# Patient Record
Sex: Female | Born: 1945 | Race: White | Hispanic: No | Marital: Married | Smoking: Former smoker
Health system: Southern US, Community
[De-identification: ages and names within clinical notes are randomized; demographics above are authoritative.]

## PROBLEM LIST (undated history)

## (undated) DIAGNOSIS — Z9289 Personal history of other medical treatment: Secondary | ICD-10-CM

## (undated) DIAGNOSIS — J45909 Unspecified asthma, uncomplicated: Secondary | ICD-10-CM

## (undated) DIAGNOSIS — I4821 Permanent atrial fibrillation: Secondary | ICD-10-CM

## (undated) DIAGNOSIS — E039 Hypothyroidism, unspecified: Secondary | ICD-10-CM

## (undated) DIAGNOSIS — J449 Chronic obstructive pulmonary disease, unspecified: Secondary | ICD-10-CM

## (undated) HISTORY — DX: Personal history of other medical treatment: Z92.89

## (undated) HISTORY — DX: Hypothyroidism, unspecified: E03.9

## (undated) HISTORY — DX: Permanent atrial fibrillation: I48.21

---

## 1987-01-22 HISTORY — PX: AUGMENTATION MAMMAPLASTY: SUR837

## 2004-09-27 ENCOUNTER — Ambulatory Visit: Payer: Self-pay | Admitting: Internal Medicine

## 2009-02-04 ENCOUNTER — Inpatient Hospital Stay: Payer: Self-pay | Admitting: Internal Medicine

## 2010-09-14 ENCOUNTER — Emergency Department: Payer: Self-pay | Admitting: Emergency Medicine

## 2010-09-18 ENCOUNTER — Ambulatory Visit: Payer: Self-pay | Admitting: Internal Medicine

## 2011-02-06 ENCOUNTER — Ambulatory Visit: Payer: Self-pay | Admitting: Unknown Physician Specialty

## 2011-02-06 LAB — URINALYSIS, COMPLETE
Bilirubin,UR: NEGATIVE
Glucose,UR: NEGATIVE mg/dL (ref 0–75)
Leukocyte Esterase: NEGATIVE
Nitrite: NEGATIVE
RBC,UR: 3 /HPF (ref 0–5)
Squamous Epithelial: 1

## 2011-02-06 LAB — BASIC METABOLIC PANEL
Anion Gap: 10 (ref 7–16)
BUN: 20 mg/dL — ABNORMAL HIGH (ref 7–18)
Calcium, Total: 9.4 mg/dL (ref 8.5–10.1)
Chloride: 102 mmol/L (ref 98–107)
Co2: 32 mmol/L (ref 21–32)
EGFR (African American): >60
EGFR (Non-African Amer.): >60
Glucose: 128 mg/dL — ABNORMAL HIGH (ref 65–99)
Sodium: 144 mmol/L (ref 136–145)

## 2011-02-06 LAB — CBC
MCH: 32.6 pg (ref 26.0–34.0)
MCHC: 34 g/dL (ref 32.0–36.0)
MCV: 96 fL (ref 80–100)
Platelet: 400 10*3/uL (ref 150–440)

## 2011-02-08 ENCOUNTER — Ambulatory Visit: Payer: Self-pay | Admitting: Unknown Physician Specialty

## 2011-07-16 ENCOUNTER — Inpatient Hospital Stay: Payer: Self-pay | Admitting: Internal Medicine

## 2011-07-16 LAB — CBC WITH DIFFERENTIAL/PLATELET
Basophil %: 0.9 %
Eosinophil #: 0.2 10*3/uL (ref 0.0–0.7)
Eosinophil %: 1.2 %
HCT: 39.3 % (ref 35.0–47.0)
HGB: 12.9 g/dL (ref 12.0–16.0)
Lymphocyte #: 2.5 10*3/uL (ref 1.0–3.6)
Lymphocyte %: 15 %
MCH: 30.3 pg (ref 26.0–34.0)
MCHC: 32.7 g/dL (ref 32.0–36.0)
MCV: 93 fL (ref 80–100)
Monocyte #: 1.9 x10 3/mm — ABNORMAL HIGH (ref 0.2–0.9)
Monocyte %: 11.2 %
Neutrophil %: 71.7 %
Platelet: 479 10*3/uL — ABNORMAL HIGH (ref 150–440)
RDW: 13.4 % (ref 11.5–14.5)
WBC: 16.7 10*3/uL — ABNORMAL HIGH (ref 3.6–11.0)

## 2011-07-16 LAB — BASIC METABOLIC PANEL
Calcium, Total: 9.2 mg/dL (ref 8.5–10.1)
Chloride: 98 mmol/L (ref 98–107)
Co2: 30 mmol/L (ref 21–32)
EGFR (Non-African Amer.): >60
Osmolality: 269 (ref 275–301)
Sodium: 134 mmol/L — ABNORMAL LOW (ref 136–145)

## 2011-07-17 LAB — CBC WITH DIFFERENTIAL/PLATELET
Basophil #: 0.1 10*3/uL (ref 0.0–0.1)
Basophil %: 0.4 %
Eosinophil %: 0 %
HCT: 37.9 % (ref 35.0–47.0)
Lymphocyte #: 0.7 10*3/uL — ABNORMAL LOW (ref 1.0–3.6)
Lymphocyte %: 4.4 %
MCH: 29.9 pg (ref 26.0–34.0)
MCV: 93 fL (ref 80–100)
Neutrophil #: 14.1 10*3/uL — ABNORMAL HIGH (ref 1.4–6.5)
RBC: 4.06 10*6/uL (ref 3.80–5.20)
WBC: 15.1 10*3/uL — ABNORMAL HIGH (ref 3.6–11.0)

## 2011-07-17 LAB — BASIC METABOLIC PANEL
BUN: 7 mg/dL (ref 7–18)
Chloride: 101 mmol/L (ref 98–107)
Co2: 27 mmol/L (ref 21–32)
Creatinine: 0.89 mg/dL (ref 0.60–1.30)
EGFR (African American): >60
EGFR (Non-African Amer.): >60
Glucose: 217 mg/dL — ABNORMAL HIGH (ref 65–99)
Potassium: 3.5 mmol/L (ref 3.5–5.1)
Sodium: 138 mmol/L (ref 136–145)

## 2012-01-20 ENCOUNTER — Encounter: Payer: Self-pay | Admitting: Physical Therapy

## 2013-08-30 DIAGNOSIS — G500 Trigeminal neuralgia: Secondary | ICD-10-CM | POA: Insufficient documentation

## 2013-08-30 DIAGNOSIS — Z87442 Personal history of urinary calculi: Secondary | ICD-10-CM | POA: Insufficient documentation

## 2013-08-30 DIAGNOSIS — E039 Hypothyroidism, unspecified: Secondary | ICD-10-CM | POA: Insufficient documentation

## 2013-08-30 DIAGNOSIS — G2581 Restless legs syndrome: Secondary | ICD-10-CM | POA: Insufficient documentation

## 2013-09-08 ENCOUNTER — Ambulatory Visit: Payer: Self-pay | Admitting: Internal Medicine
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
## 2014-05-15 NOTE — Op Note (Signed)
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   PROCEDURES: L4-L5 partial hemilaminectomy, partial facetectomy and foraminotomy with diskectomy left side.   SURGEON: Winn JockJames C. Gerrit Heckaliff, MD  ASSISTANT: Doreene Nest. Mundy, PA-C.   ANESTHESIA: General.   ESTIMATED BLOOD LOSS: 200 mL.  REPLACEMENT: 1200 mL of crystalloid.   DRAINS: None.   COMPLICATIONS: None.   BRIEF CLINICAL NOTE AND PATHOLOGY: The patient had persistent radicular pain which initially responded to conservative treatment including epidural injections. Pain, however, became recurrent, persistent and progressive with weakness. Options, risks, and benefits were discussed and patient elected to proceed with operative intervention. At the time of the procedure, there was a combination of older scarred material with fresh new disk material. There was marked stenosis and disk material within the foramen of the L5 nerve root. During the procedure with some adherent dura a very small dural perforation occurred. This was repaired without any evidence of further leak with Valsalva.   DESCRIPTION OF PROCEDURE: Preop antibiotics, adequate general anesthesia, prone position, Wilson frame used, all prominences well padded. Routine prepping and draping. Lateral fluoroscopy used to identify the appropriate level with a spinal needle and routine posterior approach was made. Fascia was opened in line with the incision and subperiosteal dissection was performed. Retractor was placed.   The microscope was then brought onto the field. Using a combination of the bur and a 45 degrees Kerrison the partial hemilaminectomy was performed. The nerve root was identified and carefully retracted. The disk material was removed. Some of this  was also removed from the foramen. The incision was thoroughly irrigated throughout the procedure. With use of the bur for further decompression a small perforation occurred as noted above. This was closed with one Nurolon stitch and use of DuraSeal. Valsalva showed no evidence of dural leak. The incision was thoroughly irrigated. Hemostasis was good. The fascia was closed with #2 quill after the area was covered with Surgiflo. Soft sterile dressing was applied after 0 quill and staples were used. Sponge and needle counts reported as correct prior to and after wound closure. The patient was awakened, taken to the postanesthesia care unit having tolerated the procedure well.    ____________________________ Winn JockJames C. Gerrit Heckaliff, MD jcc:cms D: 02/09/2011 07:03:46 ET T: 02/09/2011 10:53:14 ET JOB#: [id number]  cc: Winn JockJames C. Gerrit Heckaliff, MD, <Dictator> Winn JockJAMES C Arlind Klingerman MD ELECTRONICALLY SIGNED 02/15/2011 12:27

## 2015-02-02 ENCOUNTER — Other Ambulatory Visit: Payer: Self-pay | Admitting: Obstetrics and Gynecology

## 2015-02-02 DIAGNOSIS — Z1231 Encounter for screening mammogram for malignant neoplasm of breast: Secondary | ICD-10-CM

## 2015-02-07 ENCOUNTER — Other Ambulatory Visit: Payer: Self-pay | Admitting: Obstetrics and Gynecology

## 2015-02-07 ENCOUNTER — Ambulatory Visit
Admission: RE | Admit: 2015-02-07 | Discharge: 2015-02-07 | Disposition: A | Discharge status: Home / Self Care | Payer: Medicare Other | Source: Ambulatory Visit | Attending: Obstetrics and Gynecology | Admitting: Obstetrics and Gynecology

## 2015-02-07 DIAGNOSIS — Z1231 Encounter for screening mammogram for malignant neoplasm of breast: Secondary | ICD-10-CM | POA: Diagnosis present

## 2015-02-07 DIAGNOSIS — Z9882 Breast implant status: Secondary | ICD-10-CM | POA: Insufficient documentation

## 2016-10-22 ENCOUNTER — Other Ambulatory Visit: Payer: Self-pay | Admitting: *Deleted

## 2017-03-04 ENCOUNTER — Encounter: Payer: Self-pay | Admitting: Emergency Medicine

## 2017-03-04 ENCOUNTER — Emergency Department: Payer: Medicare Other

## 2017-03-04 ENCOUNTER — Other Ambulatory Visit: Payer: Self-pay | Admitting: Physician Assistant

## 2017-03-04 ENCOUNTER — Ambulatory Visit: Payer: Medicare Other

## 2017-03-04 ENCOUNTER — Other Ambulatory Visit: Payer: Self-pay

## 2017-03-04 ENCOUNTER — Inpatient Hospital Stay
Admission: EM | Admit: 2017-03-04 | Discharge: 2017-03-06 | DRG: 308 | Disposition: A | Discharge status: Home / Self Care | Payer: Medicare Other | Attending: Internal Medicine | Admitting: Internal Medicine

## 2017-03-04 DIAGNOSIS — E039 Hypothyroidism, unspecified: Secondary | ICD-10-CM | POA: Diagnosis present

## 2017-03-04 DIAGNOSIS — R0602 Shortness of breath: Secondary | ICD-10-CM

## 2017-03-04 DIAGNOSIS — Z79899 Other long term (current) drug therapy: Secondary | ICD-10-CM

## 2017-03-04 DIAGNOSIS — J4520 Mild intermittent asthma, uncomplicated: Secondary | ICD-10-CM | POA: Diagnosis present

## 2017-03-04 DIAGNOSIS — M351 Other overlap syndromes: Secondary | ICD-10-CM | POA: Diagnosis present

## 2017-03-04 DIAGNOSIS — G500 Trigeminal neuralgia: Secondary | ICD-10-CM | POA: Diagnosis present

## 2017-03-04 DIAGNOSIS — I4891 Unspecified atrial fibrillation: Secondary | ICD-10-CM | POA: Diagnosis not present

## 2017-03-04 DIAGNOSIS — G2581 Restless legs syndrome: Secondary | ICD-10-CM | POA: Diagnosis present

## 2017-03-04 DIAGNOSIS — R079 Chest pain, unspecified: Secondary | ICD-10-CM

## 2017-03-04 DIAGNOSIS — Z66 Do not resuscitate: Secondary | ICD-10-CM | POA: Diagnosis present

## 2017-03-04 DIAGNOSIS — J209 Acute bronchitis, unspecified: Secondary | ICD-10-CM | POA: Diagnosis present

## 2017-03-04 DIAGNOSIS — R0600 Dyspnea, unspecified: Secondary | ICD-10-CM

## 2017-03-04 DIAGNOSIS — R946 Abnormal results of thyroid function studies: Secondary | ICD-10-CM | POA: Diagnosis present

## 2017-03-04 DIAGNOSIS — J440 Chronic obstructive pulmonary disease with acute lower respiratory infection: Secondary | ICD-10-CM | POA: Diagnosis present

## 2017-03-04 DIAGNOSIS — J189 Pneumonia, unspecified organism: Secondary | ICD-10-CM

## 2017-03-04 DIAGNOSIS — R0789 Other chest pain: Secondary | ICD-10-CM

## 2017-03-04 DIAGNOSIS — J9601 Acute respiratory failure with hypoxia: Secondary | ICD-10-CM | POA: Diagnosis present

## 2017-03-04 DIAGNOSIS — F1120 Opioid dependence, uncomplicated: Secondary | ICD-10-CM | POA: Diagnosis present

## 2017-03-04 HISTORY — DX: Chronic obstructive pulmonary disease, unspecified: J44.9

## 2017-03-04 HISTORY — DX: Unspecified asthma, uncomplicated: J45.909

## 2017-03-04 LAB — HEPATIC FUNCTION PANEL
ALBUMIN: 4.1 g/dL (ref 3.5–5.0)
ALT: 43 U/L (ref 14–54)
AST: 55 U/L — ABNORMAL HIGH (ref 15–41)
Alkaline Phosphatase: 112 U/L (ref 38–126)
BILIRUBIN DIRECT: 0.2 mg/dL (ref 0.1–0.5)
BILIRUBIN TOTAL: 1.1 mg/dL (ref 0.3–1.2)
Indirect Bilirubin: 0.9 mg/dL (ref 0.3–0.9)
Total Protein: 8.3 g/dL — ABNORMAL HIGH (ref 6.5–8.1)

## 2017-03-04 LAB — BASIC METABOLIC PANEL
Anion gap: 6 (ref 5–15)
BUN: 12 mg/dL (ref 6–20)
CHLORIDE: 104 mmol/L (ref 101–111)
CO2: 29 mmol/L (ref 22–32)
Calcium: 9.3 mg/dL (ref 8.9–10.3)
Creatinine, Ser: 0.95 mg/dL (ref 0.44–1.00)
GFR calc non Af Amer: 60 mL/min — ABNORMAL LOW (ref >60)
Glucose, Bld: 79 mg/dL (ref 65–99)
Potassium: 5 mmol/L (ref 3.5–5.1)
SODIUM: 139 mmol/L (ref 135–145)

## 2017-03-04 LAB — CBC
HEMATOCRIT: 44.5 % (ref 35.0–47.0)
Hemoglobin: 14.9 g/dL (ref 12.0–16.0)
MCH: 32.3 pg (ref 26.0–34.0)
MCHC: 33.4 g/dL (ref 32.0–36.0)
MCV: 96.6 fL (ref 80.0–100.0)
PLATELETS: 388 10*3/uL (ref 150–440)
RBC: 4.6 MIL/uL (ref 3.80–5.20)
RDW: 13.5 % (ref 11.5–14.5)
WBC: 14.2 10*3/uL — AB (ref 3.6–11.0)

## 2017-03-04 LAB — TROPONIN I

## 2017-03-04 LAB — TSH
TSH: 9.962 u[IU]/mL — AB (ref 0.350–4.500)
TSH: 6.46 u[IU]/mL — AB (ref 0.350–4.500)

## 2017-03-04 LAB — FIBRIN DERIVATIVES D-DIMER (ARMC ONLY): Fibrin derivatives D-dimer (ARMC): 857.08 ng/mL (FEU) — ABNORMAL HIGH (ref 0.00–499.00)

## 2017-03-04 LAB — LIPASE, BLOOD: LIPASE: 22 U/L (ref 11–51)

## 2017-03-04 LAB — T4, FREE: Free T4: 0.85 ng/dL (ref 0.61–1.12)

## 2017-03-04 MED ORDER — ENOXAPARIN SODIUM 40 MG/0.4ML ~~LOC~~ SOLN
40.0000 mg | SUBCUTANEOUS | Status: DC
  Administered 2017-03-04 – 2017-03-05 (×2): 40 mg via SUBCUTANEOUS
  Filled 2017-03-04: qty 0.4

## 2017-03-04 MED ORDER — ONDANSETRON HCL 4 MG/2ML IJ SOLN
INTRAMUSCULAR | Status: AC
  Filled 2017-03-04: qty 2

## 2017-03-04 MED ORDER — MORPHINE SULFATE (PF) 2 MG/ML IV SOLN: 2.0000 mg | Freq: Once | INTRAVENOUS | Status: DC

## 2017-03-04 MED ORDER — CARBIDOPA-LEVODOPA 25-100 MG PO TABS
1.0000 | ORAL_TABLET | Freq: Three times a day (TID) | ORAL | Status: DC
  Administered 2017-03-04: 1 via ORAL
  Filled 2017-03-04 (×3): qty 1

## 2017-03-04 MED ORDER — IOPAMIDOL (ISOVUE-370) INJECTION 76%
75.0000 mL | Freq: Once | INTRAVENOUS | Status: AC | PRN
  Administered 2017-03-04: 75 mL via INTRAVENOUS

## 2017-03-04 MED ORDER — ONDANSETRON HCL 4 MG/2ML IJ SOLN: 4.0000 mg | Freq: Once | INTRAMUSCULAR | Status: DC

## 2017-03-04 MED ORDER — ONDANSETRON HCL 4 MG/2ML IJ SOLN: 4.0000 mg | Freq: Four times a day (QID) | INTRAMUSCULAR | Status: DC | PRN

## 2017-03-04 MED ORDER — OXYCODONE-ACETAMINOPHEN 5-325 MG PO TABS
1.0000 | ORAL_TABLET | Freq: Three times a day (TID) | ORAL | Status: DC
  Filled 2017-03-04: qty 1

## 2017-03-04 MED ORDER — ASPIRIN EC 325 MG PO TBEC
DELAYED_RELEASE_TABLET | ORAL | Status: AC
  Administered 2017-03-04: 325 mg via ORAL
  Filled 2017-03-04: qty 1

## 2017-03-04 MED ORDER — ENOXAPARIN SODIUM 40 MG/0.4ML ~~LOC~~ SOLN
SUBCUTANEOUS | Status: AC
  Administered 2017-03-04: 40 mg via SUBCUTANEOUS
  Filled 2017-03-04: qty 0.4

## 2017-03-04 MED ORDER — POLYETHYLENE GLYCOL 3350 17 G PO PACK: 17.0000 g | PACK | Freq: Every day | ORAL | Status: DC | PRN

## 2017-03-04 MED ORDER — MORPHINE SULFATE (PF) 2 MG/ML IV SOLN
INTRAVENOUS | Status: AC
  Filled 2017-03-04: qty 1

## 2017-03-04 MED ORDER — OXYCODONE HCL 5 MG PO TABS
5.0000 mg | ORAL_TABLET | Freq: Three times a day (TID) | ORAL | Status: DC
  Filled 2017-03-04: qty 1

## 2017-03-04 MED ORDER — TRAMADOL HCL 50 MG PO TABS
50.0000 mg | ORAL_TABLET | Freq: Four times a day (QID) | ORAL | Status: DC | PRN
  Administered 2017-03-05 – 2017-03-06 (×3): 50 mg via ORAL
  Filled 2017-03-04 (×3): qty 1

## 2017-03-04 MED ORDER — OXYCODONE-ACETAMINOPHEN 5-325 MG PO TABS
ORAL_TABLET | ORAL | Status: AC
  Administered 2017-03-04: 1
  Filled 2017-03-04: qty 1

## 2017-03-04 MED ORDER — CEFTRIAXONE SODIUM 1 G IJ SOLR
1.0000 g | Freq: Once | INTRAMUSCULAR | Status: AC
  Administered 2017-03-04: 1 g via INTRAVENOUS
  Filled 2017-03-04: qty 10

## 2017-03-04 MED ORDER — OXYCODONE-ACETAMINOPHEN 10-325 MG PO TABS
1.0000 | ORAL_TABLET | Freq: Three times a day (TID) | ORAL | Status: DC
Start: 2017-03-04 — End: 2017-03-04

## 2017-03-04 MED ORDER — AZITHROMYCIN 500 MG IV SOLR: 500.0000 mg | Freq: Once | INTRAVENOUS | Status: DC

## 2017-03-04 MED ORDER — ASPIRIN 81 MG PO CHEW
324.0000 mg | CHEWABLE_TABLET | Freq: Once | ORAL | Status: AC
  Administered 2017-03-04: 324 mg via ORAL

## 2017-03-04 MED ORDER — LEVOTHYROXINE SODIUM 112 MCG PO TABS
112.0000 ug | ORAL_TABLET | Freq: Every day | ORAL | Status: DC
  Administered 2017-03-05: 112 ug via ORAL
  Filled 2017-03-04: qty 1

## 2017-03-04 MED ORDER — LEVOFLOXACIN IN D5W 750 MG/150ML IV SOLN
750.0000 mg | Freq: Once | INTRAVENOUS | Status: AC
  Administered 2017-03-04: 750 mg via INTRAVENOUS
  Filled 2017-03-04: qty 150

## 2017-03-04 MED ORDER — METOPROLOL TARTRATE 5 MG/5ML IV SOLN: 5.0000 mg | INTRAVENOUS | Status: DC | PRN

## 2017-03-04 MED ORDER — ASPIRIN 81 MG PO CHEW
CHEWABLE_TABLET | ORAL | Status: AC
  Administered 2017-03-04: 324 mg via ORAL
  Filled 2017-03-04: qty 4

## 2017-03-04 MED ORDER — LEVOFLOXACIN IN D5W 750 MG/150ML IV SOLN
750.0000 mg | Freq: Every day | INTRAVENOUS | Status: DC
  Filled 2017-03-04: qty 150

## 2017-03-04 MED ORDER — ACETAMINOPHEN 325 MG PO TABS
650.0000 mg | ORAL_TABLET | Freq: Four times a day (QID) | ORAL | Status: DC | PRN
  Administered 2017-03-05: 650 mg via ORAL
  Filled 2017-03-04: qty 2

## 2017-03-04 MED ORDER — OXYCODONE-ACETAMINOPHEN 5-325 MG PO TABS
1.0000 | ORAL_TABLET | Freq: Once | ORAL | Status: AC
  Administered 2017-03-04: 1 via ORAL
  Filled 2017-03-04: qty 1

## 2017-03-04 MED ORDER — ACETAMINOPHEN 650 MG RE SUPP: 650.0000 mg | Freq: Four times a day (QID) | RECTAL | Status: DC | PRN

## 2017-03-04 MED ORDER — ASPIRIN EC 325 MG PO TBEC
325.0000 mg | DELAYED_RELEASE_TABLET | Freq: Every day | ORAL | Status: DC
  Administered 2017-03-04 – 2017-03-06 (×3): 325 mg via ORAL
  Filled 2017-03-04 (×2): qty 1

## 2017-03-04 MED ORDER — ONDANSETRON HCL 4 MG PO TABS: 4.0000 mg | ORAL_TABLET | Freq: Four times a day (QID) | ORAL | Status: DC | PRN

## 2017-03-04 NOTE — H&P (Signed)
[facility] - [place] [facility]   PATIENT NAME: Alicia Krause    MR#:  [id number]  DATE OF BIRTH:  1945-12-08  DATE OF ADMISSION:  03/04/2017  PRIMARY CARE PHYSICIAN: Gracelyn Nurse, MD   REQUESTING/REFERRING PHYSICIAN: dr Darnelle Catalan  CHIEF COMPLAINT:   sob HISTORY OF PRESENT ILLNESS:  Alicia Krause  is a 72 y.o. female with a known history of mild intermittent asthma who presents today to the emergency room complaining of left-sided chest pain as well as shortness of breath. Patient went to her primary care physician today due to shortness of breath and palpitations. She was found to have new onset atrial fibrillation. Upon arrival to the ER she was found to have low oxygen saturations. She was clammy and diaphoretic. She continues to complain of pleuritic chest pain on the left side. No relieving factors. Patient denies fever or chills.  Chest x-ray shows left lower lobe pneumonia. CT of the chest does not show evidence of pulmonary emboli.  Patient has no history of atrial fibrillation. She is found to have new onset atrial fibrillation.  PAST MEDICAL HISTORY:   Past Medical History:  Diagnosis Date  . Asthma     PAST SURGICAL HISTORY:   Past Surgical History:  Procedure Laterality Date  . AUGMENTATION MAMMAPLASTY Bilateral 1989   SILICONE - REPLACED IN 2003 WITH COMBO - RETROPECTORALIS    SOCIAL HISTORY:   Social History   Tobacco Use  . Smoking status: Not on file  Substance Use Topics  . Alcohol use: Not on file    FAMILY HISTORY:   Family History  Problem Relation Age of Onset  . Breast cancer Neg Hx     DRUG ALLERGIES:  No Known Allergies  REVIEW OF SYSTEMS:   Review of Systems  Constitutional: Negative.  Negative for chills, fever and malaise/fatigue.  HENT: Negative.  Negative for ear discharge, ear pain, hearing loss, nosebleeds and sore throat.   Eyes: Negative.  Negative for blurred vision and pain.  Respiratory: Positive for  shortness of breath. Negative for cough, hemoptysis and wheezing.   Cardiovascular: Positive for chest pain. Negative for palpitations and leg swelling.  Gastrointestinal: Negative.  Negative for abdominal pain, blood in stool, diarrhea, nausea and vomiting.  Genitourinary: Negative.  Negative for dysuria.  Musculoskeletal: Negative.  Negative for back pain.  Skin: Negative.   Neurological: Negative for dizziness, tremors, speech change, focal weakness, seizures and headaches.  Endo/Heme/Allergies: Negative.  Does not bruise/bleed easily.  Psychiatric/Behavioral: Negative.  Negative for depression, hallucinations and suicidal ideas.    MEDICATIONS AT HOME:   Prior to Admission medications   Medication Sig Start Date End Date Taking? Authorizing Provider  carbidopa-levodopa (SINEMET IR) 25-100 MG tablet Take 1 tablet by mouth 3 (three) times daily. 02/26/17   [provider]  levothyroxine (SYNTHROID, LEVOTHROID) 112 MCG tablet Take 1 tablet by mouth daily. 02/18/17   [provider]  oxyCODONE-acetaminophen (PERCOCET) 10-325 MG tablet Take 1 tablet by mouth 3 (three) times daily. 02/27/17   [provider]      VITAL SIGNS:  Blood pressure 123/66, pulse 97, temperature 98.9 F (37.2 C), temperature source Oral, resp. rate 20, height 5\' 4"  (1.626 m), weight 74.4 kg (164 lb), SpO2 95 %.  PHYSICAL EXAMINATION:   Physical Exam  Constitutional: She is oriented to person, place, and time and well-developed, well-nourished, and in no distress. No distress.  HENT:  Head: Normocephalic.  Eyes: No scleral icterus.  Neck: Normal range  of motion. Neck supple. No JVD present. No tracheal deviation present.  Cardiovascular: Normal rate and normal heart sounds. Exam reveals no gallop and no friction rub.  No murmur heard. Irregular, irregular  Pulmonary/Chest: Breath sounds normal. No respiratory distress. She has no wheezes. She has no rales. She exhibits no tenderness.   Patient is sitting upright. With increased respiratory effort. Crackles at the left base  Abdominal: Soft. Bowel sounds are normal. She exhibits no distension and no mass. There is no tenderness. There is no rebound and no guarding.  Musculoskeletal: Normal range of motion. She exhibits no edema.  Neurological: She is alert and oriented to person, place, and time.  Skin: Skin is warm. No rash noted. No erythema.  Psychiatric: Affect and judgment normal.      LABORATORY PANEL:   CBC Recent Labs  Lab 03/04/17 1545  WBC 14.2*  HGB 14.9  HCT 44.5  PLT 388   ------------------------------------------------------------------------------------------------------------------  Chemistries  Recent Labs  Lab 03/04/17 1545  NA 139  K 5.0  CL 104  CO2 29  GLUCOSE 79  BUN 12  CREATININE 0.95  CALCIUM 9.3   ------------------------------------------------------------------------------------------------------------------  Cardiac Enzymes Recent Labs  Lab 03/04/17 1545  TROPONINI <0.03   ------------------------------------------------------------------------------------------------------------------  RADIOLOGY:  Dg Chest 2 View  Result Date: 03/04/2017 CLINICAL DATA:  Shortness of breath EXAM: CHEST  2 VIEW COMPARISON:  07/16/2011 FINDINGS: Two views study shows hyperexpansion. Hazy opacity overlying the lower lungs on the frontal film is compatible with superimposition of soft tissue. However, there is density in the left costophrenic angle and at the cardiac apex, also visible on the lateral film, raising the question of pneumonia. The cardiopericardial silhouette is within normal limits for size. The visualized bony structures of the thorax are intact. IMPRESSION: Opacity in the left lung base, possible pneumonia. Follow-up imaging recommended to ensure resolution. Electronically Signed   By: Kennith Center M.D.   On: 03/04/2017 16:20   Ct Angio Chest Pe W And/or Wo  Contrast  Result Date: 03/04/2017 CLINICAL DATA:  72 year old female with tachycardia and shortness of breath since 1700 hr yesterday. Progressive symptoms. New onset atrial fibrillation. Diaphoretic. EXAM: CT ANGIOGRAPHY CHEST WITH CONTRAST TECHNIQUE: Multidetector CT imaging of the chest was performed using the standard protocol during bolus administration of intravenous contrast. Multiplanar CT image reconstructions and MIPs were obtained to evaluate the vascular anatomy. CONTRAST:  75mL ISOVUE-370 IOPAMIDOL (ISOVUE-370) INJECTION 76% COMPARISON:  Chest radiographs 03/04/2017 and earlier. FINDINGS: Cardiovascular: Excellent contrast bolus timing in the pulmonary arterial tree. Minimal respiratory motion. No focal filling defect identified in the pulmonary arteries to suggest acute pulmonary embolism. No cardiomegaly or pericardial effusion. Negative visible aorta aside from intermittent mild soft and calcified atherosclerotic plaque. Mediastinum/Nodes: No lymphadenopathy. Small partially calcified left AP window lymph node incidentally noted. Lungs/Pleura: Major airways are patent. There is mild atelectasis in the lingula. There is evidence of bilateral perihilar centrilobular emphysema. There is confluent curvilinear subpleural opacity in the lateral left costophrenic angle which most resembles atelectasis and scarring (series 6, image 75). No pleural effusion or other abnormal pulmonary opacity. Upper Abdomen: Negative visible liver, gallbladder, pancreas, adrenal glands, kidneys, and bowel in the upper abdomen. The spleen appears to be absent, and a small oblong 15 x 35 mm soft tissue focus posterior to the gastric cardia probably reflects splenosis (series 4, image 86). Musculoskeletal: Incidental breast implants. Mild thoracic scoliosis. No acute osseous abnormality identified. Review of the MIP images confirms the above findings. IMPRESSION: 1. Negative  for acute pulmonary embolus. Cardiac size within  normal limits. No pericardial effusion. 2. Emphysema (ICD10-J43.9). Left lung atelectasis and scarring. No acute pulmonary finding. 3. 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 Electronically Signed   By: Odessa FlemingH  Hall M.D.   On: 03/04/2017 17:42    EKG:   Atrial fibrillation heart rate 80 no ST elevation or depression  IMPRESSION AND PLAN:   72 year old female with history of mild intermittent asthma who presents from PCP office with new onset atrial fibrillation and shortness of breath.  1. Acute hypoxic respiratory failure in the setting of community-acquired pneumonia and new onset atrial fibrillation Wean oxygen to room air as tolerated  2. Community-acquired pneumonia: Follow up on blood cultures Levaquin ordered Incentive spirometer ordered  3. New onset atrial fibrillation with controlled heart rate Patient does not want to start heparin or anticoagulation at this time CHADS VASC score is 1 for female Start aspirin which she is agreeable Check echocardiogram Cardiology consultation requested with on call physician TSH is elevated and therefore will check T3 and T4 Continue telemetry When necessary metoprolol    4. Hypothyroidism with elevated TSH Check T3 and T4  5. Mild intermittent asthma/COPD overlap syndrome: Stable  6. History of trigeminal neuralgia/restless leg syndrome Continue Carrie dopa  7. Opiate-type dependence:  All the records are reviewed and case discussed with ED provider. Management plans discussed with the patient and she is in agreement  CODE STATUS: DO NOT RESUSCITATE  TOTAL TIME TAKING CARE OF THIS PATIENT: 40 minutes.    Brylinn Teaney M.D on 03/04/2017 at 5:49 PM  Between 7am to 6pm - Pager - [phone]  After 6pm go to [email or web address] - password [email or web address]  [facility]  Office  [phone]  CC: Primary care physician; Gracelyn NurseJohnston, John D, MD

## 2017-03-04 NOTE — ED Triage Notes (Signed)
Pt reports that she began to get SOB and felt her heart racing yesterday around 5. She went to her PMD today because it was getting worse. They found her to have a new onset of Afib. Pt is clammy and diaphoretic.

## 2017-03-04 NOTE — Progress Notes (Signed)
Pharmacy Antibiotic Note  Alicia Krause is a 72 y.o. female admitted on 03/04/2017 with pneumonia.  Pharmacy has been consulted for levofloxacin dosing.  Plan: Levofloxacin 750 mg IV once ordered in ED. Will order levofloxacin 750 mg IV q24h   Height: 5\' 4"  (162.6 cm) Weight: 164 lb (74.4 kg) IBW/kg (Calculated) : 54.7  Temp (24hrs), Avg:98.9 F (37.2 C), Min:98.9 F (37.2 C), Max:98.9 F (37.2 C)  Recent Labs  Lab 03/04/17 1545  WBC 14.2*  CREATININE 0.95    Estimated Creatinine Clearance: 55.2 mL/min (by C-G formula based on SCr of 0.95 mg/dL).    No Known Allergies  Antimicrobials this admission: levofloxacin 2/12 >>   Dose adjustments this admission:  Microbiology results: 2/12 BCx: Sent  Thank you for allowing pharmacy to be a part of this patient's care.  Cindi CarbonMary M Chidera Thivierge, PharmD, BCPS Clinical Pharmacist 03/04/2017 6:52 PM

## 2017-03-04 NOTE — ED Provider Notes (Signed)
[facility] Emergency Department Provider Note   ____________________________________________   First MD Initiated Contact with Patient 03/04/17 1519     (approximate)  I have reviewed the triage vital signs and the nursing notes.   HISTORY  Chief Complaint Chest Pain; Atrial Fibrillation; and Shortness of Breath    HPI Alicia Krause is a 72 y.o. female Patient reports feeling short of breath and heart racing yesterday and having pain in the left side of the chest under the breast and in the upper chest radiating up into the neck. Pain is worse with movement and deep breathing she cannot lay flat unless she lays on the left side because the pain gets worse. Patient is never had this before.   Past Medical History:  Diagnosis Date  . Asthma     Patient Active Problem List   Diagnosis Date Noted  . Atrial fibrillation (HCC) 03/04/2017    Past Surgical History:  Procedure Laterality Date  . AUGMENTATION MAMMAPLASTY Bilateral 1989   SILICONE - REPLACED IN 2003 WITH COMBO - RETROPECTORALIS    Prior to Admission medications   Medication Sig Start Date End Date Taking? Authorizing Provider  carbidopa-levodopa (SINEMET IR) 25-100 MG tablet Take 1 tablet by mouth 3 (three) times daily. 02/26/17   [provider]  levothyroxine (SYNTHROID, LEVOTHROID) 112 MCG tablet Take 1 tablet by mouth daily. 02/18/17   [provider]  oxyCODONE-acetaminophen (PERCOCET) 10-325 MG tablet Take 1 tablet by mouth 3 (three) times daily. 02/27/17   [provider]    Allergies Patient has no known allergies.  Family History  Problem Relation Age of Onset  . Breast cancer Neg Hx     Social History Social History   Tobacco Use  . Smoking status: Not on file  Substance Use Topics  . Alcohol use: Not on file  . Drug use: Not on file    Review of Systems  Constitutional: No fever/chills Eyes: No visual changes. ENT: No sore  throat. Cardiovascular:chest pain. Respiratory:  shortness of breath. Gastrointestinal: No abdominal pain.  No nausea, no vomiting.  No diarrhea.  No constipation. Genitourinary: Negative for dysuria. Musculoskeletal: Negative for back pain. Skin: Negative for rash. Neurological: Negative for headaches, focal weakness   ____________________________________________   PHYSICAL EXAM:  VITAL SIGNS: ED Triage Vitals  Enc Vitals Group     BP 03/04/17 1508 123/66     Pulse Rate 03/04/17 1508 97     Resp 03/04/17 1508 20     Temp 03/04/17 1508 98.9 F (37.2 C)     Temp Source 03/04/17 1508 Oral     SpO2 03/04/17 1508 95 %     Weight 03/04/17 1509 164 lb (74.4 kg)     Height 03/04/17 1509 5\' 4"  (1.626 m)     Head Circumference --      Peak Flow --      Pain Score 03/04/17 1509 9     Pain Loc --      Pain Edu? --      Excl. in GC? --     Constitutional: Alert and oriented. looks short of breath Eyes: Conjunctivae are normal. Head: Atraumatic. Nose: No congestion/rhinnorhea. Mouth/Throat: Mucous membranes are moist.  Oropharynx non-erythematous. Neck: No stridor.  Cardiovascular: Normal rate, rirregular rhythm. Grossly normal heart sounds.  Good peripheral circulation. Respiratory: Normal respiratory effort.  No retractions. Lungs CTAB.some tenderness on palpation left chest wall under the breast. Not elsewhere Gastrointestinal: Soft and nontender. No distention. No  abdominal bruits. No CVA tenderness. Musculoskeletal: No lower extremity tenderness nor edema.  No joint effusions. Neurologic:  Normal speech and language. No gross focal neurologic deficits are appreciated. No gait instability. Skin:  Skin is warm, dry and intact. No rash noted. Psychiatric: Mood and affect are normal. Speech and behavior are normal.  ____________________________________________   LABS (all labs ordered are listed, but only abnormal results are displayed)  Labs Reviewed  BASIC METABOLIC PANEL  - Abnormal; Notable for the following components:      Result Value   GFR calc non Af Amer 60 (*)    All other components within normal limits  CBC - Abnormal; Notable for the following components:   WBC 14.2 (*)    All other components within normal limits  TSH - Abnormal; Notable for the following components:   TSH 9.962 (*)    All other components within normal limits  FIBRIN DERIVATIVES D-DIMER ([facility] ONLY) - Abnormal; Notable for the following components:   Fibrin derivatives D-dimer ([facility]) 857.08 (*)    All other components within normal limits  CULTURE, BLOOD (ROUTINE X 2)  CULTURE, BLOOD (ROUTINE X 2)  TROPONIN I  HEPATIC FUNCTION PANEL  LIPASE, BLOOD   ____________________________________________  EKG  EKG read and interpreted by me shows what appears to be atrial fibrillation at a rate of 91 normal axis no acute ST-T wave changes ____________________________________________  RADIOLOGY  ED MD interpretation: chest x-ray possible pneumonia CT of the chest shows no PE per radiology Official radiology report(s): Dg Chest 2 View  Result Date: 03/04/2017 CLINICAL DATA:  Shortness of breath EXAM: CHEST  2 VIEW COMPARISON:  07/16/2011 FINDINGS: Two views study shows hyperexpansion. Hazy opacity overlying the lower lungs on the frontal film is compatible with superimposition of soft tissue. However, there is density in the left costophrenic angle and at the cardiac apex, also visible on the lateral film, raising the question of pneumonia. The cardiopericardial silhouette is within normal limits for size. The visualized bony structures of the thorax are intact. IMPRESSION: Opacity in the left lung base, possible pneumonia. Follow-up imaging recommended to ensure resolution. Electronically Signed   By: Kennith Center M.D.   On: 03/04/2017 16:20   Ct Angio Chest Pe W And/or Wo Contrast  Result Date: 03/04/2017 CLINICAL DATA:  72 year old female with tachycardia and shortness of  breath since 1700 hr yesterday. Progressive symptoms. New onset atrial fibrillation. Diaphoretic. EXAM: CT ANGIOGRAPHY CHEST WITH CONTRAST TECHNIQUE: Multidetector CT imaging of the chest was performed using the standard protocol during bolus administration of intravenous contrast. Multiplanar CT image reconstructions and MIPs were obtained to evaluate the vascular anatomy. CONTRAST:  75mL ISOVUE-370 IOPAMIDOL (ISOVUE-370) INJECTION 76% COMPARISON:  Chest radiographs 03/04/2017 and earlier. FINDINGS: Cardiovascular: Excellent contrast bolus timing in the pulmonary arterial tree. Minimal respiratory motion. No focal filling defect identified in the pulmonary arteries to suggest acute pulmonary embolism. No cardiomegaly or pericardial effusion. Negative visible aorta aside from intermittent mild soft and calcified atherosclerotic plaque. Mediastinum/Nodes: No lymphadenopathy. Small partially calcified left AP window lymph node incidentally noted. Lungs/Pleura: Major airways are patent. There is mild atelectasis in the lingula. There is evidence of bilateral perihilar centrilobular emphysema. There is confluent curvilinear subpleural opacity in the lateral left costophrenic angle which most resembles atelectasis and scarring (series 6, image 75). No pleural effusion or other abnormal pulmonary opacity. Upper Abdomen: Negative visible liver, gallbladder, pancreas, adrenal glands, kidneys, and bowel in the upper abdomen. The spleen appears to be absent, and  a small oblong 15 x 35 mm soft tissue focus posterior to the gastric cardia probably reflects splenosis (series 4, image 86). Musculoskeletal: Incidental breast implants. Mild thoracic scoliosis. No acute osseous abnormality identified. Review of the MIP images confirms the above findings. IMPRESSION: 1. Negative for acute pulmonary embolus. Cardiac size within normal limits. No pericardial effusion. 2. Emphysema (ICD10-J43.9). Left lung atelectasis and scarring. No  acute pulmonary finding. 3. 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 Electronically Signed   By: Odessa Fleming M.D.   On: 03/04/2017 17:42    ____________________________________________   PROCEDURES  Procedure(s) performed:   Procedures  Critical Care performed:   ____________________________________________   INITIAL IMPRESSION / ASSESSMENT AND PLAN / ED COURSE  patient with new onset chest pain and dyspnea. No fever she does have a white count. She does have new onset atrial fib. We will give her some antibiotics just in case the density in the chest left lower lung is a pneumonia. We'll investigate causes of A. fib and her anticoagulate her.         ____________________________________________   FINAL CLINICAL IMPRESSION(S) / ED DIAGNOSES  Final diagnoses:  Dyspnea, unspecified type  Chest pain, unspecified type  Community acquired pneumonia, unspecified laterality  New onset atrial fibrillation [facility])     ED Discharge Orders    None       Note:  This document was prepared using Dragon voice recognition software and may include unintentional dictation errors.    Arnaldo Natal, MD 03/04/17 [phone]

## 2017-03-04 NOTE — ED Notes (Signed)
PT reports continued pain at this time. RN noted 10-325 percocet ordered but close to 5-325 administration. ED MD verbal order to give a second 5-325 Percocet recieved

## 2017-03-04 NOTE — Progress Notes (Signed)
Family Meeting Note  Advance Directive:yes  Today a meeting took place with the Patient.spouse  The following clinical team members were present during this meeting:MD  The following were discussed:Patient's diagnosis:new onset atrial fibrillation with community-acquired pneumonia and acute hypoxic respiratory failure , Patient's progosis: > 12 months and Goals for treatment: DNR  Additional follow-up to be provided: Patient's husband will try to bring in advanced rectus No update needed  Time spent during discussion: 16 minutes  Dre Gamino, MD

## 2017-03-05 ENCOUNTER — Inpatient Hospital Stay (HOSPITAL_COMMUNITY)
Admit: 2017-03-05 | Discharge: 2017-03-05 | Disposition: A | Discharge status: Home / Self Care | Payer: Medicare Other | Attending: Internal Medicine | Admitting: Internal Medicine

## 2017-03-05 DIAGNOSIS — I4891 Unspecified atrial fibrillation: Secondary | ICD-10-CM

## 2017-03-05 LAB — CBC
HCT: 41.5 % (ref 35.0–47.0)
HEMOGLOBIN: 13.6 g/dL (ref 12.0–16.0)
MCH: 31.9 pg (ref 26.0–34.0)
MCHC: 32.8 g/dL (ref 32.0–36.0)
MCV: 97.3 fL (ref 80.0–100.0)
PLATELETS: 352 10*3/uL (ref 150–440)
RBC: 4.27 MIL/uL (ref 3.80–5.20)
RDW: 13.3 % (ref 11.5–14.5)
WBC: 11.7 10*3/uL — AB (ref 3.6–11.0)

## 2017-03-05 LAB — ECHOCARDIOGRAM COMPLETE
HEIGHTINCHES: 64 in
WEIGHTICAEL: 2632 [oz_av]

## 2017-03-05 LAB — BASIC METABOLIC PANEL
ANION GAP: 7 (ref 5–15)
BUN: 13 mg/dL (ref 6–20)
CALCIUM: 8.8 mg/dL — AB (ref 8.9–10.3)
CO2: 31 mmol/L (ref 22–32)
CREATININE: 0.81 mg/dL (ref 0.44–1.00)
Chloride: 102 mmol/L (ref 101–111)
GFR calc Af Amer: >60 mL/min (ref >60)
Glucose, Bld: 113 mg/dL — ABNORMAL HIGH (ref 65–99)
Potassium: 4.3 mmol/L (ref 3.5–5.1)
SODIUM: 140 mmol/L (ref 135–145)

## 2017-03-05 LAB — PROCALCITONIN

## 2017-03-05 MED ORDER — MORPHINE SULFATE (PF) 2 MG/ML IV SOLN
2.0000 mg | INTRAVENOUS | Status: DC | PRN
  Administered 2017-03-05: 2 mg via INTRAVENOUS
  Filled 2017-03-05: qty 1

## 2017-03-05 MED ORDER — CARBIDOPA-LEVODOPA 25-100 MG PO TABS
1.0000 | ORAL_TABLET | Freq: Every day | ORAL | Status: DC
  Administered 2017-03-05: 1 via ORAL
  Filled 2017-03-05 (×2): qty 1

## 2017-03-05 MED ORDER — IPRATROPIUM-ALBUTEROL 0.5-2.5 (3) MG/3ML IN SOLN
3.0000 mL | Freq: Four times a day (QID) | RESPIRATORY_TRACT | Status: DC
  Administered 2017-03-05 – 2017-03-06 (×4): 3 mL via RESPIRATORY_TRACT
  Filled 2017-03-05 (×5): qty 3

## 2017-03-05 MED ORDER — LEVOTHYROXINE SODIUM 25 MCG PO TABS
125.0000 ug | ORAL_TABLET | Freq: Every day | ORAL | Status: DC
  Administered 2017-03-06: 125 ug via ORAL
  Filled 2017-03-05: qty 1

## 2017-03-05 MED ORDER — IBUPROFEN 400 MG PO TABS
400.0000 mg | ORAL_TABLET | Freq: Four times a day (QID) | ORAL | Status: DC
  Administered 2017-03-05 – 2017-03-06 (×4): 400 mg via ORAL
  Filled 2017-03-05 (×5): qty 1

## 2017-03-05 NOTE — Progress Notes (Signed)
Attempted to walk patient. Patient's oxygen saturation went down to 91% on room air. While standing, patient started feeling "shaky". No dizziness, just short of breath. Patient asked if we could try again later.Will re-attempt at another time. Will continue to monitor patient.  Patient back in the bed, 97% on 2L.

## 2017-03-05 NOTE — Consult Note (Signed)
[facility]  CARDIOLOGY CONSULT NOTE  Patient ID: Alicia Krause MRN: [id number] DOB/AGE: 22-Jan-1946 72 y.o.  Admit date: 03/04/2017 Referring Physician  Primary Physician Dr. Letitia Libra Primary Cardiologist N/A Reason for Consultation Atrial fibrillation   HPI: Alicia Krause is a 72 year old female with PMH of asthma, COPD, hypothyroidism who presented to the ED on 2/12 with left sided chest pain, shortness of breath, and a racing heart. For the past two days, patient hasn't been feeling "quite right." Her chest pain is worse with inspiration and lying flat. The pain in somewhat relieved with lying on her left side. While in the ED, patient was noted to be in atrial fibrillation with no prior history of any cardiac arrhythmia. Cardiology was consulted upon admission. Today, the patient is lying in bed, in some discomfort. She admits to left sided, sharp chest pain right below her left breast. She also complains of shortness of breath. The patient denies palpitations, peripheral edema, abdominal pain, N/V/D. No significant past cardiac history to report.   The patient does not have a cardiologist in which she follows up with on a regular basis. She states she underwent a stress test over 10 years ago with Dr. Dan Humphreys, but does not recall the results. Has had no cardiac work-up since.   Patient does have a significant family history for cardiac disease. Both her mother and father passed away in their 81s due to MIs. 2 of her brothers have previous history of CAD as well.   Review of systems complete and found to be negative unless listed above     Past Medical History:  Diagnosis Date  . Asthma     Past Surgical History:  Procedure Laterality Date  . AUGMENTATION MAMMAPLASTY Bilateral 1989   SILICONE - REPLACED IN 2003 WITH COMBO - RETROPECTORALIS    Medications Prior to Admission  Medication Sig Dispense Refill Last Dose  . carbidopa-levodopa (SINEMET IR) 25-100 MG tablet Take 1 tablet by  mouth daily.   10 03/04/2017 at Unknown time  . levothyroxine (SYNTHROID, LEVOTHROID) 112 MCG tablet Take 1 tablet by mouth daily.  0 03/04/2017 at Unknown time  . oxyCODONE-acetaminophen (PERCOCET) 10-325 MG tablet Take 1 tablet by mouth 3 (three) times daily.  0 03/04/2017 at Unknown time   Social History   Socioeconomic History  . Marital status: Married    Spouse name: Not on file  . Number of children: Not on file  . Years of education: Not on file  . Highest education level: Not on file  Social Needs  . Financial resource strain: Not on file  . Food insecurity - worry: Not on file  . Food insecurity - inability: Not on file  . Transportation needs - medical: Not on file  . Transportation needs - non-medical: Not on file  Occupational History  . Not on file  Tobacco Use  . Smoking status: Not on file  Substance and Sexual Activity  . Alcohol use: Not on file  . Drug use: Not on file  . Sexual activity: Not on file  Other Topics Concern  . Not on file  Social History Narrative  . Not on file    Family History  Problem Relation Age of Onset  . Breast cancer Neg Hx       Review of systems complete and found to be negative unless listed above      PHYSICAL EXAM  General: Well developed, well nourished, in mild distress HEENT:  Normocephalic and atramatic Neck:  No JVD.  Lungs: Crackles noted in left lower lobe. Clear to ausculation on right side.  Heart: Normal rate, irregular rhythm . Normal S1 and S2 without gallops or murmurs.  Abdomen: Bowel sounds are positive, abdomen soft and non-tender  Msk:  Back normal, normal gait. Normal strength and tone for age. Extremities: No clubbing, cyanosis or edema.   Neuro: Alert and oriented X 3. Psych:  Good affect, responds appropriately  Labs:   Lab Results  Component Value Date   WBC 11.7 (H) 03/05/2017   HGB 13.6 03/05/2017   HCT 41.5 03/05/2017   MCV 97.3 03/05/2017   PLT 352 03/05/2017    Recent Labs  Lab  03/04/17 1835 03/05/17 0501  NA  --  140  K  --  4.3  CL  --  102  CO2  --  31  BUN  --  13  CREATININE  --  0.81  CALCIUM  --  8.8*  PROT 8.3*  --   BILITOT 1.1  --   ALKPHOS 112  --   ALT 43  --   AST 55*  --   GLUCOSE  --  113*   Lab Results  Component Value Date   TROPONINI <0.03 03/04/2017   No results found for: CHOL No results found for: HDL No results found for: LDLCALC No results found for: TRIG No results found for: CHOLHDL No results found for: LDLDIRECT    Radiology: Dg Chest 2 View  Result Date: 03/04/2017 CLINICAL DATA:  Shortness of breath EXAM: CHEST  2 VIEW COMPARISON:  07/16/2011 FINDINGS: Two views study shows hyperexpansion. Hazy opacity overlying the lower lungs on the frontal film is compatible with superimposition of soft tissue. However, there is density in the left costophrenic angle and at the cardiac apex, also visible on the lateral film, raising the question of pneumonia. The cardiopericardial silhouette is within normal limits for size. The visualized bony structures of the thorax are intact. IMPRESSION: Opacity in the left lung base, possible pneumonia. Follow-up imaging recommended to ensure resolution. Electronically Signed   By: Kennith CenterEric  Mansell M.D.   On: 03/04/2017 16:20   Ct Angio Chest Pe W And/or Wo Contrast  Result Date: 03/04/2017 CLINICAL DATA:  72 year old female with tachycardia and shortness of breath since 1700 hr yesterday. Progressive symptoms. New onset atrial fibrillation. Diaphoretic. EXAM: CT ANGIOGRAPHY CHEST WITH CONTRAST TECHNIQUE: Multidetector CT imaging of the chest was performed using the standard protocol during bolus administration of intravenous contrast. Multiplanar CT image reconstructions and MIPs were obtained to evaluate the vascular anatomy. CONTRAST:  75mL ISOVUE-370 IOPAMIDOL (ISOVUE-370) INJECTION 76% COMPARISON:  Chest radiographs 03/04/2017 and earlier. FINDINGS: Cardiovascular: Excellent contrast bolus timing in  the pulmonary arterial tree. Minimal respiratory motion. No focal filling defect identified in the pulmonary arteries to suggest acute pulmonary embolism. No cardiomegaly or pericardial effusion. Negative visible aorta aside from intermittent mild soft and calcified atherosclerotic plaque. Mediastinum/Nodes: No lymphadenopathy. Small partially calcified left AP window lymph node incidentally noted. Lungs/Pleura: Major airways are patent. There is mild atelectasis in the lingula. There is evidence of bilateral perihilar centrilobular emphysema. There is confluent curvilinear subpleural opacity in the lateral left costophrenic angle which most resembles atelectasis and scarring (series 6, image 75). No pleural effusion or other abnormal pulmonary opacity. Upper Abdomen: Negative visible liver, gallbladder, pancreas, adrenal glands, kidneys, and bowel in the upper abdomen. The spleen appears to be absent, and a small oblong 15 x 35 mm soft tissue focus posterior to the  gastric cardia probably reflects splenosis (series 4, image 86). Musculoskeletal: Incidental breast implants. Mild thoracic scoliosis. No acute osseous abnormality identified. Review of the MIP images confirms the above findings. IMPRESSION: 1. Negative for acute pulmonary embolus. Cardiac size within normal limits. No pericardial effusion. 2. Emphysema (ICD10-J43.9). Left lung atelectasis and scarring. No acute pulmonary finding. 3. 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 Electronically Signed   By: Odessa Fleming M.D.   On: 03/04/2017 17:42    EKG: Atrial fibrillation, rate controlled  ASSESSMENT AND PLAN:  1. Left lower lobe pneumonia   - CXR reveals opacity in left lung base  - Elevated WBC 2. New onset atrial fibrillation   - Rate controlled  - No prior history of atrial fibrillation, likely in the setting of pneumonia   - CHADsVASc score of 1  Recommendations:  1. Continue current  medication regimen for left lower lobe pneumonia 2. Continue aspirin 3. Defer anticoagulation for atrial fibrillation at this time with CHADsVASc score of 1 4. Rate control for new onset atrial fibrillation with metoprolol as needed 5. Obtain 2D echocardiogram  6. Review 2D echocardiogram, futher plan pending results  7. Follow up with outpatient cardiology   Signed: Marga Hoots PA-S  The history, physical exam findings, and plan were discussed with Dr. Darrold Junker and all decision making was made in collaboration.   03/05/2017, 8:12 AM

## 2017-03-05 NOTE — Progress Notes (Signed)
Weaned patient down from 3L of O2 to 2L.  95% on 2L. Will continue to monitor patient.

## 2017-03-05 NOTE — Progress Notes (Addendum)
Nutrition Brief Note  Patient identified on the Malnutrition Screening Tool (MST) Report  72 year old female with PMH of asthma, COPD, hypothyroidism who presented to the ED on 2/12 with left sided chest pain, shortness of breath, and a racing heart. Pt found to have Afib.   Wt Readings from Last 15 Encounters:  03/04/17 164 lb 8 oz (74.6 kg)    Body mass index is 28.24 kg/m. Patient meets criteria for overweight based on current BMI. Per chart, pt is weight stable.   Current diet order is regular, patient is consuming approximately 50% of meals at this time. Pt reports eating a sandwich for lunch today. Pt declines supplements at this time. Labs and medications reviewed.   No nutrition interventions warranted at this time. If nutrition issues arise, please consult RD.   Alicia Krause Alicia Dannemiller MS, RD, LDN Pager #- [phone] After Hours Pager: [phone]

## 2017-03-05 NOTE — Plan of Care (Signed)
  Progressing Education: Knowledge of General Education information will improve 03/05/2017 0421 - Progressing by Dorna LeitzNesbitt, Vincenzina Jagoda M, RN Pain Managment: General experience of comfort will improve 03/05/2017 0421 - Progressing by Dorna LeitzNesbitt, Lexus Barletta M, RN Safety: Ability to remain free from injury will improve 03/05/2017 0421 - Progressing by Dorna LeitzNesbitt, Foxx Klarich M, RN Activity: Ability to tolerate increased activity will improve 03/05/2017 0421 - Progressing by Dorna LeitzNesbitt, Mersadies Petree M, RN Cardiac: Ability to achieve and maintain adequate cardiovascular perfusion will improve 03/05/2017 0421 - Progressing by Dorna LeitzNesbitt, Diesha Rostad M, RN

## 2017-03-05 NOTE — Progress Notes (Signed)
*  PRELIMINARY RESULTS* Echocardiogram 2D Echocardiogram has been performed.  Alicia Krause 03/05/2017, 9:51 AM

## 2017-03-05 NOTE — Progress Notes (Signed)
1        [facility] -  at [facility]   PATIENT NAME: Alicia Krause    MR#:  [id number]  DATE OF BIRTH:  1946-01-12  SUBJECTIVE:  CHIEF COMPLAINT:   Chief Complaint  Patient presents with  . Chest Pain  . Atrial Fibrillation  . Shortness of Breath  Somewhat better, still short of breath REVIEW OF SYSTEMS:  Review of Systems  Constitutional: Negative for chills, fever and weight loss.  HENT: Negative for nosebleeds and sore throat.   Eyes: Negative for blurred vision.  Respiratory: Positive for cough and shortness of breath. Negative for wheezing.   Cardiovascular: Negative for chest pain, orthopnea, leg swelling and PND.  Gastrointestinal: Negative for abdominal pain, constipation, diarrhea, heartburn, nausea and vomiting.  Genitourinary: Negative for dysuria and urgency.  Musculoskeletal: Negative for back pain.  Skin: Negative for rash.  Neurological: Negative for dizziness, speech change, focal weakness and headaches.  Endo/Heme/Allergies: Does not bruise/bleed easily.  Psychiatric/Behavioral: Negative for depression.    DRUG ALLERGIES:  No Known Allergies VITALS:  Blood pressure 124/68, pulse 83, temperature 97.8 F (36.6 C), temperature source Oral, resp. rate 18, height 5\' 4"  (1.626 m), weight 74.6 kg (164 lb 8 oz), SpO2 95 %. PHYSICAL EXAMINATION:  Physical Exam  Constitutional: She is oriented to person, place, and time and well-developed, well-nourished, and in no distress.  HENT:  Head: Normocephalic and atraumatic.  Eyes: Conjunctivae and EOM are normal. Pupils are equal, round, and reactive to light.  Neck: Normal range of motion. Neck supple. No tracheal deviation present. No thyromegaly present.  Cardiovascular: Normal rate, regular rhythm and normal heart sounds.  Pulmonary/Chest: Effort normal and breath sounds normal. No respiratory distress. She has no wheezes. She exhibits no tenderness.  Abdominal: Soft. Bowel sounds are normal.  She exhibits no distension. There is no tenderness.  Musculoskeletal: Normal range of motion.  Neurological: She is alert and oriented to person, place, and time. No cranial nerve deficit.  Skin: Skin is warm and dry. No rash noted.  Psychiatric: Mood and affect normal.   LABORATORY PANEL:  Female CBC Recent Labs  Lab 03/05/17 0501  WBC 11.7*  HGB 13.6  HCT 41.5  PLT 352   ------------------------------------------------------------------------------------------------------------------ Chemistries  Recent Labs  Lab 03/04/17 1835 03/05/17 0501  NA  --  140  K  --  4.3  CL  --  102  CO2  --  31  GLUCOSE  --  113*  BUN  --  13  CREATININE  --  0.81  CALCIUM  --  8.8*  AST 55*  --   ALT 43  --   ALKPHOS 112  --   BILITOT 1.1  --    RADIOLOGY:  Ct Angio Chest Pe W And/or Wo Contrast  Result Date: 03/04/2017 CLINICAL DATA:  72 year old female with tachycardia and shortness of breath since 1700 hr yesterday. Progressive symptoms. New onset atrial fibrillation. Diaphoretic. EXAM: CT ANGIOGRAPHY CHEST WITH CONTRAST TECHNIQUE: Multidetector CT imaging of the chest was performed using the standard protocol during bolus administration of intravenous contrast. Multiplanar CT image reconstructions and MIPs were obtained to evaluate the vascular anatomy. CONTRAST:  75mL ISOVUE-370 IOPAMIDOL (ISOVUE-370) INJECTION 76% COMPARISON:  Chest radiographs 03/04/2017 and earlier. FINDINGS: Cardiovascular: Excellent contrast bolus timing in the pulmonary arterial tree. Minimal respiratory motion. No focal filling defect identified in the pulmonary arteries to suggest acute pulmonary embolism. No cardiomegaly or pericardial effusion. Negative visible aorta aside from intermittent mild soft  and calcified atherosclerotic plaque. Mediastinum/Nodes: No lymphadenopathy. Small partially calcified left AP window lymph node incidentally noted. Lungs/Pleura: Major airways are patent. There is mild atelectasis in  the lingula. There is evidence of bilateral perihilar centrilobular emphysema. There is confluent curvilinear subpleural opacity in the lateral left costophrenic angle which most resembles atelectasis and scarring (series 6, image 75). No pleural effusion or other abnormal pulmonary opacity. Upper Abdomen: Negative visible liver, gallbladder, pancreas, adrenal glands, kidneys, and bowel in the upper abdomen. The spleen appears to be absent, and a small oblong 15 x 35 mm soft tissue focus posterior to the gastric cardia probably reflects splenosis (series 4, image 86). Musculoskeletal: Incidental breast implants. Mild thoracic scoliosis. No acute osseous abnormality identified. Review of the MIP images confirms the above findings. IMPRESSION: 1. Negative for acute pulmonary embolus. Cardiac size within normal limits. No pericardial effusion. 2. Emphysema (ICD10-J43.9). Left lung atelectasis and scarring. No acute pulmonary finding. 3. 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 Electronically Signed   By: Odessa Fleming M.D.   On: 03/04/2017 17:42   ASSESSMENT AND PLAN:  72 year old female with history of mild intermittent asthma who presents from PCP office with new onset atrial fibrillation and shortness of breath.  1. Acute hypoxic respiratory failure in the setting of acute bronchitis and new onset atrial fibrillation Wean oxygen to room air as tolerated  2. Community-acquired pneumonia: Neg blood cultures -This is ruled out on the CT chest -Pro-calcitonin is negative we will stop antibiotics  3. New onset atrial fibrillation with controlled heart rate Patient does not want to start heparin or anticoagulation at this time CHADS VASC score is 1 for female - continue aspirin which she is agreeable normal echocardiogram Cardiology input appreciated Continue telemetry When necessary metoprolol   4. Hypothyroidism : Continue levothyroxine with some  increased dose due to elevated TSH normal T3 and T4  5. Mild intermittent asthma/COPD syndrome: Stable  6. History of trigeminal neuralgia/restless leg syndrome Continue Carrie dopa  7. Opiate-type dependence:      All the records are reviewed and case discussed with Care Management/Social Worker. Management plans discussed with the patient, nursing and they are in agreement.  CODE STATUS: DNR  TOTAL TIME TAKING CARE OF THIS PATIENT: 35 minutes.   More than 50% of the time was spent in counseling/coordination of care: YES  POSSIBLE D/C IN 1 DAYS, DEPENDING ON CLINICAL CONDITION.   Delfino Lovett M.D on 03/05/2017 at 5:17 PM  Between 7am to 6pm - Pager - [phone]  After 6pm go to [email or web address] - Social research officer, government  [facility] [place] [facility]  Office  [phone]  CC: Primary care physician; Gracelyn Nurse, MD  Note: This dictation was prepared with Dragon dictation along with smaller phrase technology. Any transcriptional errors that result from this process are unintentional.

## 2017-03-06 ENCOUNTER — Encounter: Payer: Self-pay | Admitting: Emergency Medicine

## 2017-03-06 LAB — CBC
HEMATOCRIT: 42.9 % (ref 35.0–47.0)
Hemoglobin: 14.1 g/dL (ref 12.0–16.0)
MCH: 31.9 pg (ref 26.0–34.0)
MCHC: 32.8 g/dL (ref 32.0–36.0)
MCV: 97.1 fL (ref 80.0–100.0)
PLATELETS: 388 10*3/uL (ref 150–440)
RBC: 4.41 MIL/uL (ref 3.80–5.20)
RDW: 13.6 % (ref 11.5–14.5)
WBC: 13.9 10*3/uL — AB (ref 3.6–11.0)

## 2017-03-06 LAB — BASIC METABOLIC PANEL
ANION GAP: 12 (ref 5–15)
BUN: 14 mg/dL (ref 6–20)
CO2: 28 mmol/L (ref 22–32)
CREATININE: 0.67 mg/dL (ref 0.44–1.00)
Calcium: 9.3 mg/dL (ref 8.9–10.3)
Chloride: 103 mmol/L (ref 101–111)
GFR calc Af Amer: >60 mL/min (ref >60)
GLUCOSE: 140 mg/dL — AB (ref 65–99)
Potassium: 3.8 mmol/L (ref 3.5–5.1)
Sodium: 143 mmol/L (ref 135–145)

## 2017-03-06 LAB — PROCALCITONIN: Procalcitonin: <0.1 ng/mL

## 2017-03-06 LAB — T3: T3, Total: 76 ng/dL (ref 71–180)

## 2017-03-06 MED ORDER — ASPIRIN 325 MG PO TBEC: 325.0000 mg | DELAYED_RELEASE_TABLET | Freq: Every day | ORAL | 0 refills | Status: DC

## 2017-03-06 MED ORDER — METOPROLOL SUCCINATE ER 25 MG PO TB24: 25.0000 mg | ORAL_TABLET | Freq: Every day | ORAL | 11 refills | Status: DC

## 2017-03-06 NOTE — Plan of Care (Signed)
  Progressing Clinical Measurements: Respiratory complications will improve 03/06/2017 0210 - Progressing by Stefan Churchogers, Ronita Hargreaves M, RN   Clinical Measurements: Respiratory complications will improve 03/06/2017 0210 - Progressing by Stefan Churchogers, Rory Xiang M, RN

## 2017-03-06 NOTE — Progress Notes (Signed)
Patient stable for discharge.   PIV discontinued, catheter intact; site clean, dry, intact. Discharge instructions and follow-up reviewed. Patient verbalized understanding.   Family at bedside to take patient home.   Patient received home oxygen.

## 2017-03-06 NOTE — Progress Notes (Signed)
SATURATION QUALIFICATIONS: (This note is used to comply with regulatory documentation for home oxygen)  Patient Saturations on Room Air at Rest = 98%  Patient Saturations on Room Air while Ambulating = 79%  Patient Saturations on 2 Liters of oxygen while Ambulating = 100%  Please briefly explain why patient needs home oxygen:   Patient walked from the bed to her door and oxygen saturation dropped to 79%. Immediately put 2L of oxygen on and oxygen sats went up to 100%. Patient walked with nurse at her side around nurse station with oxygen on. Patient wanted to try again to walk without oxygen at a slower pace than normal walking pace. Patient walked down the hallway from her room at a slow pace and oxygen sats stayed between 97-98%. Oxygen sats begin to drop down to 90% and advised patient to stop walking and stand there for about 1-432min. While standing there, her oxygen begin to quickly climb back up to 98-100%. Patient walked  back to her room with one "rest stop" and without oxygen and saturations maintained around 98%.

## 2017-03-06 NOTE — Progress Notes (Signed)
Pt refusing bed alarm. Educated pt on reason of bed alarm. Will continue to monitor and assess.

## 2017-03-06 NOTE — Discharge Instructions (Signed)

## 2017-03-08 NOTE — Discharge Summary (Signed)
[facility] - [place] at [facility]   PATIENT NAME: Alicia Krause    MR#:  [id number]  DATE OF BIRTH:  06-16-1945  DATE OF ADMISSION:  03/04/2017   ADMITTING PHYSICIAN: Adrian Saran, MD  DATE OF DISCHARGE: 03/06/2017  2:46 PM  PRIMARY CARE PHYSICIAN: Gracelyn Nurse, MD   ADMISSION DIAGNOSIS:  New onset atrial fibrillation (HCC) [I48.91] Dyspnea, unspecified type [R06.00] Chest pain, unspecified type [R07.9] Community acquired pneumonia, unspecified laterality [J18.9] DISCHARGE DIAGNOSIS:  Active Problems:   Atrial fibrillation (HCC)  SECONDARY DIAGNOSIS:   Past Medical History:  Diagnosis Date  . Asthma   . COPD (chronic obstructive pulmonary disease) (HCC)    asthma and COPD developed 17 years ago after moving to [place] from [facility]   HOSPITAL COURSE:  [name]  is a 72 y.o. female with a known history of mild intermittent asthma admitted for left-sided chest pain as well as shortness of breath. Patient went to her primary care physician today due to shortness of breath and palpitations. She was found to have new onset atrial fibrillation. Upon arrival to the ER she was found to have low oxygen saturations. She was clammy and diaphoretic. She continues to complain of pleuritic chest pain on the left side. CT of the chest does not show evidence of pulmonary emboli.  1. Acute hypoxic respiratory failure in the setting of acute bronchitis and new onset atrial fibrillation  2. Community-acquired pneumonia: Ruled out. Neg blood cultures -This is ruled out on the CT chest -Pro-calcitonin is negative so stopped antibiotics  3. New onset atrial fibrillation with controlled heart rate Patient does not want to start heparin or anticoagulation at this time CHADS VASC score is 1 for female - continueaspirin which she is agreeable and so is cardiology normal echocardiogram  4. Hypothyroidism : Continue levothyroxine - has elevated TSH normal T3 and T4. Recheck TSH  as an outpt and still remains elevated, consider increasing synthroid dose.  5. Mild intermittent asthma/COPD syndrome: Stable  6. History of trigeminal neuralgia/restless leg syndrome Continue Carrie dopa  7. Opiate-type dependence: DISCHARGE CONDITIONS:  stable CONSULTS OBTAINED:  Treatment Team:  Marcina Millard, MD DRUG ALLERGIES:  No Known Allergies DISCHARGE MEDICATIONS:   Allergies as of 03/06/2017   No Known Allergies     Medication List    TAKE these medications   aspirin 325 MG EC tablet Take 1 tablet (325 mg total) by mouth daily.   carbidopa-levodopa 25-100 MG tablet Commonly known as:  SINEMET IR Take 1 tablet by mouth daily.   levothyroxine 112 MCG tablet Commonly known as:  SYNTHROID, LEVOTHROID Take 1 tablet by mouth daily.   metoprolol succinate 25 MG 24 hr tablet Commonly known as:  TOPROL XL Take 1 tablet (25 mg total) by mouth daily.   oxyCODONE-acetaminophen 10-325 MG tablet Commonly known as:  PERCOCET Take 1 tablet by mouth 3 (three) times daily.        DISCHARGE INSTRUCTIONS:   DIET:  Regular diet DISCHARGE CONDITION:  Good ACTIVITY:  Activity as tolerated OXYGEN:  Home Oxygen: No.  Oxygen Delivery: room air DISCHARGE LOCATION:  home   If you experience worsening of your admission symptoms, develop shortness of breath, life threatening emergency, suicidal or homicidal thoughts you must seek medical attention immediately by calling 911 or calling your MD immediately  if symptoms less severe.  You Must read complete instructions/literature along with all the possible adverse reactions/side effects for all the Medicines you take and that have  been prescribed to you. Take any new Medicines after you have completely understood and accpet all the possible adverse reactions/side effects.   Please note  You were cared for by a hospitalist during your hospital stay. If you have any questions about your discharge medications or  the care you received while you were in the hospital after you are discharged, you can call the unit and asked to speak with the hospitalist on call if the hospitalist that took care of you is not available. Once you are discharged, your primary care physician will handle any further medical issues. Please note that NO REFILLS for any discharge medications will be authorized once you are discharged, as it is imperative that you return to your primary care physician (or establish a relationship with a primary care physician if you do not have one) for your aftercare needs so that they can reassess your need for medications and monitor your lab values.    On the day of Discharge:  VITAL SIGNS:  Blood pressure 132/69, pulse (!) 106, temperature 97.7 F (36.5 C), temperature source Oral, resp. rate 18, height 5\' 4"  (1.626 m), weight 74.6 kg (164 lb 8 oz), SpO2 95 %. PHYSICAL EXAMINATION:  GENERAL:  72 y.o.-year-old patient lying in the bed with no acute distress.  EYES: Pupils equal, round, reactive to light and accommodation. No scleral icterus. Extraocular muscles intact.  HEENT: Head atraumatic, normocephalic. Oropharynx and nasopharynx clear.  NECK:  Supple, no jugular venous distention. No thyroid enlargement, no tenderness.  LUNGS: Normal breath sounds bilaterally, no wheezing, rales,rhonchi or crepitation. No use of accessory muscles of respiration.  CARDIOVASCULAR: S1, S2 normal. No murmurs, rubs, or gallops.  ABDOMEN: Soft, non-tender, non-distended. Bowel sounds present. No organomegaly or mass.  EXTREMITIES: No pedal edema, cyanosis, or clubbing.  NEUROLOGIC: Cranial nerves II through XII are intact. Muscle strength 5/5 in all extremities. Sensation intact. Gait not checked.  PSYCHIATRIC: The patient is alert and oriented x 3.  SKIN: No obvious rash, lesion, or ulcer.  DATA REVIEW:   CBC Recent Labs  Lab 03/06/17 0452  WBC 13.9*  HGB 14.1  HCT 42.9  PLT 388    Chemistries    Recent Labs  Lab 03/04/17 1835  03/06/17 0452  NA  --    < > 143  K  --    < > 3.8  CL  --    < > 103  CO2  --    < > 28  GLUCOSE  --    < > 140*  BUN  --    < > 14  CREATININE  --    < > 0.67  CALCIUM  --    < > 9.3  AST 55*  --   --   ALT 43  --   --   ALKPHOS 112  --   --   BILITOT 1.1  --   --    < > = values in this interval not displayed.     Follow-up Information    Gracelyn NurseJohnston, John D, MD. Go on 03/13/2017.   Specialty:  Internal Medicine Why:  Appointment Time: 9:30am Contact information: [address] [place] [address] [phone]             Management plans discussed with the patient, family and they are in agreement.  CODE STATUS: Prior   TOTAL TIME TAKING CARE OF THIS PATIENT: 45 minutes.    Delfino LovettVipul Glendoris Nodarse M.D on 03/08/2017 at 11:13 AM  Between 7am to 6pm - Pager - [phone]  After 6pm go to [email or web address] - Social research officer, government  [facility] [place] [facility]  Office  [phone]  CC: Primary care physician; Gracelyn Nurse, MD   Note: This dictation was prepared with Dragon dictation along with smaller phrase technology. Any transcriptional errors that result from this process are unintentional.

## 2017-03-09 LAB — CULTURE, BLOOD (ROUTINE X 2)
Culture: NO GROWTH
Culture: NO GROWTH
SPECIAL REQUESTS: ADEQUATE
Special Requests: ADEQUATE

## 2017-04-25 ENCOUNTER — Ambulatory Visit: Admission: RE | Admit: 2017-04-25 | Payer: Medicare Other | Source: Ambulatory Visit | Admitting: Cardiology

## 2017-04-25 ENCOUNTER — Encounter: Admission: RE | Payer: Self-pay | Source: Ambulatory Visit

## 2017-04-25 SURGERY — CARDIOVERSION (CATH LAB)
Anesthesia: General

## 2017-12-11 ENCOUNTER — Ambulatory Visit: Payer: Medicare Other | Admitting: Cardiovascular Disease

## 2017-12-16 ENCOUNTER — Encounter: Payer: Self-pay | Admitting: Cardiovascular Disease

## 2017-12-16 ENCOUNTER — Ambulatory Visit: Payer: Medicare Other | Admitting: Cardiovascular Disease

## 2017-12-16 VITALS — BP 124/80 | HR 74 | Ht 60.0 in | Wt 170.0 lb

## 2017-12-16 DIAGNOSIS — M545 Low back pain, unspecified: Secondary | ICD-10-CM

## 2017-12-16 DIAGNOSIS — I4891 Unspecified atrial fibrillation: Secondary | ICD-10-CM

## 2017-12-16 DIAGNOSIS — F17200 Nicotine dependence, unspecified, uncomplicated: Secondary | ICD-10-CM | POA: Diagnosis not present

## 2017-12-16 DIAGNOSIS — G8929 Other chronic pain: Secondary | ICD-10-CM

## 2017-12-16 DIAGNOSIS — J432 Centrilobular emphysema: Secondary | ICD-10-CM | POA: Insufficient documentation

## 2017-12-16 MED ORDER — RIVAROXABAN 20 MG PO TABS: 20.0000 mg | ORAL_TABLET | Freq: Every day | ORAL | 11 refills | Status: DC

## 2017-12-16 NOTE — Patient Instructions (Addendum)
Medication Instructions:   Call the mail order folks and see which is cheaper: eliquis 5 mg twice a day vs xarelto 20 mg daily  For now start xarelto 20 mg once a day (food) Medication Samples have been provided to the patient.  Drug name: Xarelto       Strength: 20 mg        Qty: 2 bottles  LOT: 18MG [id number]  Exp.Date: 08/21   If you need a refill on your cardiac medications before your next appointment, please call your pharmacy.    Lab work: No new labs needed   If you have labs (blood work) drawn today and your tests are completely normal, you will receive your results only by: Marland Kitchen. MyChart Message (if you have MyChart) OR . A paper copy in the mail If you have any lab test that is abnormal or we need to change your treatment, we will call you to review the results.   Testing/Procedures: No new testing needed   Follow-Up: At [facility], you and your health needs are our priority.  As part of our continuing mission to provide you with exceptional heart care, we have created designated Provider Care Teams.  These Care Teams include your primary Cardiologist (physician) and Advanced Practice Providers (APPs -  Physician Assistants and Nurse Practitioners) who all work together to provide you with the care you need, when you need it.  . You will need a follow up appointment in 3 months .   Please call our office 2 months in advance to schedule this appointment.    . Providers on your designated Care Team:   . Nicolasa Duckinghristopher Berge, NP . Eula Listenyan Dunn, PA-C . Marisue IvanJacquelyn Visser, PA-C  Any Other Special Instructions Will Be Listed Below (If Applicable).  For educational health videos Log in to : [email or web address] Or : [email or web address], password : [email or web address]

## 2017-12-16 NOTE — Progress Notes (Signed)
Cardiology Office Note  Date:  12/16/2017   ID:  Alicia MinaDiane Krause, DOB 1945/02/14, MRN [id number]  PCP:  Gracelyn NurseJohnston, John D, MD   Chief Complaint  Patient presents with  . New Patient (Initial Visit)    Afib cardiac HX [place]. Medications reviewed verbally.    HPI:  [name] is a 72 year old woman with past medical history of Persistent atrial fibrillation  Prior smoking history COPD exacerbation February 2019 with pneumonia Severe chronic back pain, s/p surgery, additional pain medication Who presents to establish care, discussion of atrial fibrillation  very inactive, limited by back pain Does not want any more surgery Difficulty getting up and down from chair, difficulty getting up on the exam table today Tries to do some range of motion exercises at home Thinks the pain is getting worse in general  Remains in atrial fibrillation today, feels she is asymptomatic though does report increased fatigue, slight shortness of breath on exertion.  She attributes this to her asthma  She has not been taking metoprolol, flecainide, any anticoagulation Only takes aspirin Reports having tremors on the medications above  EKG personally reviewed by myself on todays visit Shows atrial fibrillation with ventricular rate 74 bpm nonspecific T wave abnormality  Previous hospital records reviewed  hospitalized 03/04/2017 with COPD exacerbation and pneumonia.   in atrial fibrillation with rapid ventricular rate.   2D echocardiogram was performed 03/05/2017 which revealed normal left ventricular function, with LVEF of 60-65%.   24-hour Holter monitor revealed predominant atrial fibrillation with a mean heart rate of 71 bpm.   EKG performed 04/2017 showed atrial fibrillation    trial of flecainide 50 mg started on 03/31/17.   frequent dizzy spells and tremors.     PMH:   has a past medical history of Asthma and COPD (chronic obstructive pulmonary disease) (HCC).  PSH:    Past Surgical  History:  Procedure Laterality Date  . AUGMENTATION MAMMAPLASTY Bilateral 1989   SILICONE - REPLACED IN 2003 WITH COMBO - RETROPECTORALIS    Current Outpatient Medications  Medication Sig Dispense Refill  . albuterol (PROVENTIL HFA;VENTOLIN HFA) 108 (90 Base) MCG/ACT inhaler Inhale into the lungs.    . carbidopa-levodopa (SINEMET IR) 25-100 MG tablet Take 1 tablet by mouth daily.   10  . levothyroxine (SYNTHROID, LEVOTHROID) 112 MCG tablet Take 112 mcg by mouth daily before breakfast.   0  . oxyCODONE-acetaminophen (PERCOCET) 10-325 MG tablet Take 1 tablet by mouth 3 (three) times daily.  0  . rivaroxaban (XARELTO) 20 MG TABS tablet Take 1 tablet (20 mg total) by mouth daily with supper. 30 tablet 11   No current facility-administered medications for this visit.      Allergies:   Latex   Social History:  The patient  reports that she quit smoking about 5 years ago. Her smoking use included cigarettes. She quit after 30.00 years of use. She has never used smokeless tobacco. She reports that she drank alcohol. She reports that she has current or past drug history. Drug: Oxycodone.   Family History:   family history includes Heart disease in her brother, father, mother, and sister; Heart failure in her brother, father, mother, and sister.    Review of Systems: Review of Systems  Constitutional: Negative.   Respiratory: Negative.   Cardiovascular: Negative.   Gastrointestinal: Negative.   Musculoskeletal: Positive for back pain.  Neurological: Negative.   Psychiatric/Behavioral: Negative.   All other systems reviewed and are negative.    PHYSICAL EXAM: VS:  BP 124/80 (BP Location: Right Arm, Patient Position: Sitting, Cuff Size: Normal)   Pulse 74   Ht 5' (1.524 m)   Wt 170 lb (77.1 kg)   BMI 33.20 kg/m  , BMI Body mass index is 33.2 kg/m. GEN: Well nourished, well developed, in no acute distress , difficulty maneuvering around the room secondary to back pain HEENT: normal   Neck: no JVD, carotid bruits, or masses Cardiac: RRR; no murmurs, rubs, or gallops,no edema  Respiratory:  clear to auscultation bilaterally, normal work of breathing GI: soft, nontender, nondistended, + BS MS: no deformity or atrophy  Skin: warm and dry, no rash Neuro:  Strength and sensation are intact Psych: euthymic mood, full affect   Recent Labs: 03/04/2017: ALT 43; TSH 6.460 03/06/2017: BUN 14; Creatinine, Ser 0.67; Hemoglobin 14.1; Platelets 388; Potassium 3.8; Sodium 143    Lipid Panel No results found for: CHOL, HDL, LDLCALC, TRIG    Wt Readings from Last 3 Encounters:  12/16/17 170 lb (77.1 kg)  03/04/17 164 lb 8 oz (74.6 kg)       ASSESSMENT AND PLAN:  Atrial fibrillation, unspecified type (HCC) - Plan: EKG 12-Lead Persistent atrial fibrillation noted February 2019 Unclear if it started with her COPD exacerbation and pneumonia or if she had this in 2018 No prior EKGs available She is relatively asymptomatic and does not want cardioversion CHADS VASC AT least 2  Very minimal aortic atherosclerosis noted in the arch After long discussion concerning anticoagulation she is willing to start xarelto 20 mg daily Samples and coupon provided  Chronic low back pain, unspecified back pain laterality, unspecified whether sciatica present Severe symptoms, difficulty maneuvering around the room, limiting her ability to walk She prefers no further surgeries Recommend she see pain clinic Orthopedics referrals also provided  Smoker Stop smoking 7 years ago  Centrilobular emphysema (HCC) Reports that she stopped smoking Prior COPD exacerbation pneumonia February 2019 On inhaler as needed  Disposition:   F/U  3 months   Total encounter time more than 45 minutes  Greater than 50% was spent in counseling and coordination of care with the patient    Orders Placed This Encounter  Procedures  . EKG 12-Lead     Signed, Dossie Arbour, M.D., Ph.D. 12/16/2017  [facility] [place], [place] [phone]

## 2018-03-15 DIAGNOSIS — J449 Chronic obstructive pulmonary disease, unspecified: Secondary | ICD-10-CM | POA: Insufficient documentation

## 2018-03-15 NOTE — Progress Notes (Signed)
Cardiology Office Note  Date:  03/16/2018   ID:  Alicia Krause, DOB [birth year], MRN [id number]  PCP:  Gracelyn Nurse, MD   Chief Complaint  Patient presents with  . Other    3 month follow up. Patient c/o gums bleeding alot more with starting Xarelto and states she is very disoriented. Patient deneis chest pain and SOB at this time. Meds reviewed verbally with patient.     HPI:  Alicia Krause is a 73 year old woman with past medical history of Persistent atrial fibrillation since February 2019 Prior smoking history COPD exacerbation February 2019 with pneumonia, atrial fib Severe chronic back pain, s/p surgery, additional pain medication Who presents for f/u of her atrial fibrillation  Reports that she previously tried Eliquis and did not feel well, had shaking and jerking but was on flecainide at the same time  Now on Xarelto 20 mg daily She is very careful with her teeth, washes with brush and WaterPik 6-7 times per day Has gum bleeding on a chronic basis Tried everything to make it get better Claims the Xarelto  Dizzy/vertigo, not tried meclizine Happens when she is supine in bed and other times  Inactive, no regular exercise program Chronic back pain  EKG personally reviewed by myself on todays visit Shows atrial fibrillation with ventricular rate 76 bpm nonspecific T wave abnormality  Other past medical history reviewed hospitalized 03/04/2017 with COPD exacerbation and pneumonia.   in atrial fibrillation with rapid ventricular rate.   2D echocardiogram was performed 03/05/2017 which revealed normal left ventricular function, with LVEF of 60-65%.   24-hour Holter monitor revealed predominant atrial fibrillation with a mean heart rate of 71 bpm.   EKG performed 04/2017 showed atrial fibrillation    trial of flecainide 50 mg started on 03/31/17.   frequent dizzy spells and tremors.     PMH:   has a past medical history of Asthma and COPD (chronic obstructive  pulmonary disease) (HCC).  PSH:    Past Surgical History:  Procedure Laterality Date  . AUGMENTATION MAMMAPLASTY Bilateral 1989   SILICONE - REPLACED IN 2003 WITH COMBO - RETROPECTORALIS    Current Outpatient Medications  Medication Sig Dispense Refill  . albuterol (PROVENTIL HFA;VENTOLIN HFA) 108 (90 Base) MCG/ACT inhaler Inhale into the lungs.    . carbidopa-levodopa (SINEMET IR) 25-100 MG tablet Take 1 tablet by mouth daily.   10  . levothyroxine (SYNTHROID, LEVOTHROID) 112 MCG tablet Take 112 mcg by mouth daily before breakfast.   0  . oxyCODONE-acetaminophen (PERCOCET) 10-325 MG tablet Take 1 tablet by mouth 3 (three) times daily.  0  . rivaroxaban (XARELTO) 20 MG TABS tablet Take 1 tablet (20 mg total) by mouth daily with supper. 30 tablet 11   No current facility-administered medications for this visit.      Allergies:   Latex   Social History:  The patient  reports that she quit smoking about 6 years ago. Her smoking use included cigarettes. She quit after 30.00 years of use. She has never used smokeless tobacco. She reports previous alcohol use. She reports current drug use. Drug: Oxycodone.   Family History:   family history includes Heart disease in her brother, father, mother, and sister; Heart failure in her brother, father, mother, and sister.    Review of Systems: Review of Systems  Constitutional: Negative.   HENT:       Gum bleeding  Respiratory: Negative.   Cardiovascular: Negative.   Gastrointestinal: Negative.   Musculoskeletal: Positive for  back pain.  Neurological: Negative.   Psychiatric/Behavioral: Negative.   All other systems reviewed and are negative.    PHYSICAL EXAM: VS:  BP 133/78 (BP Location: Left Arm, Patient Position: Sitting, Cuff Size: Normal)   Pulse 76   Ht 5\' 4"  (1.626 m)   Wt 169 lb (76.7 kg)   BMI 29.01 kg/m  , BMI Body mass index is 29.01 kg/m. Constitutional:  oriented to person, place, and time. No distress.  HENT:   Head: Grossly normal Eyes:  no discharge. No scleral icterus.  Neck: No JVD, no carotid bruits  Cardiovascular: Irregularly irregular, no murmurs appreciated Pulmonary/Chest: Clear to auscultation bilaterally, no wheezes or rails Abdominal: Soft.  no distension.  no tenderness.  Musculoskeletal: Normal range of motion Neurological:  normal muscle tone. Coordination normal. No atrophy Skin: Skin warm and dry Psychiatric: normal affect, pleasant    Recent Labs: No results found for requested labs within last 8760 hours.    Lipid Panel No results found for: CHOL, HDL, LDLCALC, TRIG    Wt Readings from Last 3 Encounters:  03/16/18 169 lb (76.7 kg)  12/16/17 170 lb (77.1 kg)  03/04/17 164 lb 8 oz (74.6 kg)       ASSESSMENT AND PLAN:  Atrial fibrillation, unspecified type (HCC) - Plan: EKG 12-Lead Persistent atrial fibrillation noted February 2019 Previously did not want cardioversion CHADS VASC AT least 2  Brushing her teeth 6-7 times per day Recommend she try to decrease her teeth cleaning if possible as this is likely leading to gum irritation She would like to retry Eliquis 5 mg twice daily Other option if she continues to have gum bleeding would be to try Pradaxa  Chronic low back pain, unspecified back pain laterality, unspecified whether sciatica present Severe symptoms, difficulty maneuvering around the room, limiting her ability to walk She prefers no further surgeries Previous referral made to pain clinic  Smoker Stopped smoking 7 years ago Continued cessation recommended  Centrilobular emphysema (HCC) Prior COPD exacerbation pneumonia February 2019 On inhaler as needed  No recent COPD exacerbation  Disposition:   F/U  12 months   Total encounter time more than 25 minutes  Greater than 50% was spent in counseling and coordination of care with the patient    Orders Placed This Encounter  Procedures  . EKG 12-Lead     Signed, Dossie Arbour, M.D.,  Ph.D. 03/16/2018  [facility] [place], [place] [phone]

## 2018-03-16 ENCOUNTER — Encounter: Payer: Self-pay | Admitting: Cardiovascular Disease

## 2018-03-16 ENCOUNTER — Ambulatory Visit: Payer: Medicare Other | Admitting: Cardiovascular Disease

## 2018-03-16 VITALS — BP 133/78 | HR 76 | Ht 64.0 in | Wt 169.0 lb

## 2018-03-16 DIAGNOSIS — I4891 Unspecified atrial fibrillation: Secondary | ICD-10-CM

## 2018-03-16 DIAGNOSIS — J432 Centrilobular emphysema: Secondary | ICD-10-CM | POA: Diagnosis not present

## 2018-03-16 DIAGNOSIS — F17200 Nicotine dependence, unspecified, uncomplicated: Secondary | ICD-10-CM | POA: Diagnosis not present

## 2018-03-16 NOTE — Patient Instructions (Addendum)
Medication Instructions:  Your physician has recommended you make the following change in your medication:  1. STOP Xarelto 2. START Eliquis 5 mg twice a day 3. TRY over the counter Meclizine 25 mg over the counter for vertigo   Medication Samples have been provided to the patient.  Drug name: Eliquis       Strength: 5 mg        Qty: 2 boxes  LOT: [id number]  Exp.Date: 6/22   If that does not work,  We can try pradaxa   If you need a refill on your cardiac medications before your next appointment, please call your pharmacy.    Lab work: No new labs needed   If you have labs (blood work) drawn today and your tests are completely normal, you will receive your results only by: Marland Kitchen MyChart Message (if you have MyChart) OR . A paper copy in the mail If you have any lab test that is abnormal or we need to change your treatment, we will call you to review the results.   Testing/Procedures: No new testing needed   Follow-Up: At [facility], you and your health needs are our priority.  As part of our continuing mission to provide you with exceptional heart care, we have created designated Provider Care Teams.  These Care Teams include your primary Cardiologist (physician) and Advanced Practice Providers (APPs -  Physician Assistants and Nurse Practitioners) who all work together to provide you with the care you need, when you need it.  . You will need a follow up appointment in 12 months .   Please call our office 2 months in advance to schedule this appointment.    . Providers on your designated Care Team:   . Nicolasa Ducking, NP . Eula Listen, PA-C . Marisue Ivan, PA-C  Any Other Special Instructions Will Be Listed Below (If Applicable).  For educational health videos Log in to : [email or web address] Or : [email or web address], password : [email or web address]

## 2018-04-13 ENCOUNTER — Other Ambulatory Visit: Payer: Self-pay | Admitting: Cardiovascular Disease

## 2018-04-13 ENCOUNTER — Other Ambulatory Visit: Payer: Self-pay

## 2018-04-13 MED ORDER — APIXABAN 5 MG PO TABS: 5.0000 mg | ORAL_TABLET | Freq: Two times a day (BID) | ORAL | 0 refills | Status: DC

## 2018-04-13 NOTE — Telephone Encounter (Signed)
Per Dr. Windell Hummingbird instructions at [facility] 03/16/18: "Medication Instructions:  Your physician has recommended you make the following change in your medication:  1. STOP Xarelto 2. START Eliquis 5 mg twice a day"

## 2018-04-13 NOTE — Telephone Encounter (Signed)
Please review for refill, Thanks !  

## 2018-04-13 NOTE — Telephone Encounter (Signed)
°*  STAT* If patient is at the pharmacy, call can be transferred to refill team.   1. Which medications need to be refilled? (please list name of each medication and dose if known) Eliquis 5 mg po BID   2. Which pharmacy/location (including street and city if local pharmacy) is medication to be sent to? [facility]  [address]   3. Do they need a 30 day or 90 day supply? 90

## 2018-08-26 ENCOUNTER — Telehealth: Payer: Self-pay | Admitting: Cardiovascular Disease

## 2018-08-26 MED ORDER — APIXABAN 5 MG PO TABS: 5.0000 mg | ORAL_TABLET | Freq: Two times a day (BID) | ORAL | 0 refills | Status: DC

## 2018-08-26 NOTE — Telephone Encounter (Signed)
Pt's age 73, wt 76.7 kg, SCr 0.8, CrCl 79.23 based on labs from 01/19/18. Pt is overdue for BMET to assess kidney function. Will send in 30 day supply and have scheduling call to set up lab appt at pt's convenience.

## 2018-08-26 NOTE — Telephone Encounter (Signed)
°*  STAT* If patient is at the pharmacy, call can be transferred to refill team.   1. Which medications need to be refilled? (please list name of each medication and dose if known) Eliquis 5 mg po BID   2. Which pharmacy/location (including street and city if local pharmacy) is medication to be sent to? [facility] [address] in [place]    3. Do they need a 30 day or 90 day supply? 90  Fwd to anticoag

## 2018-08-27 NOTE — Telephone Encounter (Addendum)
Patient concerned about covid exposure and risk factors.  She declines to schedule labs now but wants to have these done at pcp office with routine check up and labs in 6-8 weeks

## 2018-12-23 ENCOUNTER — Other Ambulatory Visit: Payer: Self-pay | Admitting: Internal Medicine

## 2018-12-23 DIAGNOSIS — Z1231 Encounter for screening mammogram for malignant neoplasm of breast: Secondary | ICD-10-CM

## 2019-05-21 ENCOUNTER — Ambulatory Visit: Payer: Medicare Other | Admitting: Nurse Practitioner

## 2019-05-21 ENCOUNTER — Encounter: Payer: Self-pay | Admitting: Nurse Practitioner

## 2019-05-21 DIAGNOSIS — I4821 Permanent atrial fibrillation: Secondary | ICD-10-CM | POA: Insufficient documentation

## 2019-05-21 DIAGNOSIS — J45909 Unspecified asthma, uncomplicated: Secondary | ICD-10-CM | POA: Insufficient documentation

## 2019-05-21 NOTE — Progress Notes (Deleted)
Office Visit    Patient Name: Alicia Krause Date of Encounter: 05/21/2019  Primary Care Provider:  Baxter Hire, MD Primary Cardiologist:  Ida Rogue, MD  Chief Complaint    74 year old female with a history of permanent atrial fibrillation, COPD/asthma, hypothyroidism, and gingival bleeding in the setting of oral anticoagulation, who presents for follow-up of atrial fibrillation.  Past Medical History    Past Medical History:  Diagnosis Date  . Asthma   . COPD (chronic obstructive pulmonary disease) ([place])    a. asthma and COPD developed ~ 2002 after moving to [place] from [place].  Marland Kitchen History of echocardiogram    a. 02/2017 Echo: EF 60-65%, no rwma, nl LA size, nl RV fxn. Nl PASP.  Marland Kitchen Hypothyroidism   . Permanent atrial fibrillation ([place])    a. Prev failed flecainide; b. CHA2DDS2VASc -2-->Eliquis.   Past Surgical History:  Procedure Laterality Date  . AUGMENTATION MAMMAPLASTY Bilateral 2841   SILICONE - REPLACED IN 2003 WITH COMBO - RETROPECTORALIS    Allergies  Allergies  Allergen Reactions  . Latex Hives and Itching    History of Present Illness    74 year old female with above past medical history including permanent atrial fibrillation, hypothyroidism, and COPD/asthma.  She was initially tried on flecainide therapy but did not tolerate this and has therefore simply been rate controlled (has not required an AV nodal blocking agent).  She was previously on Eliquis but did not feel that she tolerated this (shaking and jerking) and it was subsequently changed to Xarelto therapy.  She has had chronic issues with gingival bleeding in the setting of oral anticoagulation and frequent oral hygiene, and at her request, xarelto was switched back to eliquis in 02/2018.  Since her last visit in 02/2018 ***  Home Medications    Prior to Admission medications   Medication Sig Start Date End Date Taking? Authorizing Provider  albuterol (PROVENTIL HFA;VENTOLIN HFA) 108 (90 Base)  MCG/ACT inhaler Inhale into the lungs. 09/24/17   [provider]  apixaban (ELIQUIS) 5 MG TABS tablet Take 1 tablet (5 mg total) by mouth 2 (two) times daily. Needs labs for further refills. 08/26/18   Minna Merritts, MD  carbidopa-levodopa (SINEMET IR) 25-100 MG tablet Take 1 tablet by mouth daily.  02/26/17   [provider]  levothyroxine (SYNTHROID, LEVOTHROID) 112 MCG tablet Take 112 mcg by mouth daily before breakfast.  02/18/17   [provider]  oxyCODONE-acetaminophen (PERCOCET) 10-325 MG tablet Take 1 tablet by mouth 3 (three) times daily. 02/27/17   [provider]    Review of Systems    ***.  All other systems reviewed and are otherwise negative except as noted above.  Physical Exam    VS:  There were no vitals taken for this visit. , BMI There is no height or weight on file to calculate BMI. GEN: Well nourished, well developed, in no acute distress. HEENT: normal. Neck: Supple, no JVD, carotid bruits, or masses. Cardiac: RRR, no murmurs, rubs, or gallops. No clubbing, cyanosis, edema.  Radials/DP/PT 2+ and equal bilaterally.  Respiratory:  Respirations regular and unlabored, clear to auscultation bilaterally. GI: Soft, nontender, nondistended, BS + x 4. MS: no deformity or atrophy. Skin: warm and dry, no rash. Neuro:  Strength and sensation are intact. Psych: Normal affect.  Accessory Clinical Findings    ECG personally reviewed by me today - *** - no acute changes.  Labs from [facility] dated October 16, 2018: Hemoglobin 15.9, hematocrit 40.5, WBCs  9.9, platelets 370 Sodium 140, potassium 4.5, chloride 102, CO2 29.8, BUN 12, creatinine 1.0, glucose 103 Calcium 9.5, total protein 7.1, albumin 4.4 Total bilirubin 0.7, alkaline phosphatase 72, AST 21 ALT 5 Total cholesterol 209, triglycerides 96, HDL 61.8, LDL 150 TSH 63.244  2.079 on f/u 11/26/2018.  Assessment & Plan    1.  ***   Nicolasa Ducking, NP 05/21/2019, 10:20 AM

## 2019-08-02 NOTE — Progress Notes (Signed)
Cardiology Office Note  Date:  08/03/2019   ID:  Alicia Krause, DOB September 12, 1945, MRN [id number]  PCP:  Gracelyn Nurse, MD   Chief Complaint  Patient presents with  . office visit    Pt has no complaints. Meds verbally reviewed w/ pt.    HPI:  Alicia Krause is a 74 year old woman with past medical history of Persistent atrial fibrillation since February 2019 Prior smoking history COPD exacerbation February 2019 with pneumonia, atrial fib Severe chronic back pain, s/p surgery, additional pain medication Who presents for f/u of her atrial fibrillation  In follow-up today, reports things are relatively stable Weight up 7 pounds Active Walks 2x a day, limited by back, 30 min at a time, 1 hr total  Back stable, but has chronic pain  She stopped Xarelto in the past secondary to bleeding and side effects Was changed to Eliquis On last visit reported having some gum bleeding, On today's visit she reports that she has stopped the Eliquis, has not been on it for some time Denies any TIA or stroke symptoms  Denies any chest pain or shortness of breath on exertion  EKG personally reviewed by myself on todays visit Shows atrial fibrillation with ventricular rate 83 bpm no significant ST or T wave change   Other past medical history reviewed hospitalized 03/04/2017 with COPD exacerbation and pneumonia.   in atrial fibrillation with rapid ventricular rate.   2D echocardiogram was performed 03/05/2017 which revealed normal left ventricular function, with LVEF of 60-65%.   24-hour Holter monitor revealed predominant atrial fibrillation with a mean heart rate of 71 bpm.   EKG performed 04/2017 showed atrial fibrillation    trial of flecainide 50 mg started on 03/31/17.   frequent dizzy spells and tremors.     PMH:   has a past medical history of Asthma, COPD (chronic obstructive pulmonary disease) (HCC), History of echocardiogram, Hypothyroidism, and Permanent atrial fibrillation  (HCC).  PSH:    Past Surgical History:  Procedure Laterality Date  . AUGMENTATION MAMMAPLASTY Bilateral 1989   SILICONE - REPLACED IN 2003 WITH COMBO - RETROPECTORALIS    Current Outpatient Medications  Medication Sig Dispense Refill  . albuterol (PROVENTIL HFA;VENTOLIN HFA) 108 (90 Base) MCG/ACT inhaler Inhale into the lungs.    . carbidopa-levodopa (SINEMET IR) 25-100 MG tablet Take 1 tablet by mouth daily.   10  . levothyroxine (SYNTHROID, LEVOTHROID) 112 MCG tablet Take 112 mcg by mouth daily before breakfast.   0  . oxyCODONE-acetaminophen (PERCOCET) 10-325 MG tablet Take 1 tablet by mouth 3 (three) times daily.  0  . apixaban (ELIQUIS) 5 MG TABS tablet Take 1 tablet (5 mg total) by mouth 2 (two) times daily. 60 tablet 11   No current facility-administered medications for this visit.     Allergies:   Latex   Social History:  The patient  reports that she quit smoking about 7 years ago. Her smoking use included cigarettes. She quit after 30.00 years of use. She has never used smokeless tobacco. She reports previous alcohol use. She reports current drug use. Drug: Oxycodone.   Family History:   family history includes Heart disease in her brother, father, mother, and sister; Heart failure in her brother, father, mother, and sister.    Review of Systems: Review of Systems  Constitutional: Negative.   HENT:       Gum bleeding  Respiratory: Negative.   Cardiovascular: Negative.   Gastrointestinal: Negative.   Musculoskeletal: Positive for back pain.  Neurological: Negative.   Psychiatric/Behavioral: Negative.   All other systems reviewed and are negative.    PHYSICAL EXAM: VS:  BP 130/76 (BP Location: Left Arm, Patient Position: Sitting, Cuff Size: Normal)   Pulse 83   Ht 5\' 4"  (1.626 m)   Wt 176 lb 4 oz (79.9 kg)   SpO2 93%   BMI 30.25 kg/m  , BMI Body mass index is 30.25 kg/m. Constitutional:  oriented to person, place, and time. No distress.  HENT:  Head:  Grossly normal Eyes:  no discharge. No scleral icterus.  Neck: No JVD, no carotid bruits  Cardiovascular: Regular rate and rhythm, no murmurs appreciated Pulmonary/Chest: Clear to auscultation bilaterally, no wheezes or rails Abdominal: Soft.  no distension.  no tenderness.  Musculoskeletal: Normal range of motion Neurological:  normal muscle tone. Coordination normal. No atrophy Skin: Skin warm and dry Psychiatric: normal affect, pleasant  Recent Labs: No results found for requested labs within last 8760 hours.    Lipid Panel No results found for: CHOL, HDL, LDLCALC, TRIG    Wt Readings from Last 3 Encounters:  08/03/19 176 lb 4 oz (79.9 kg)  03/16/18 169 lb (76.7 kg)  12/16/17 170 lb (77.1 kg)     ASSESSMENT AND PLAN:  Atrial fibrillation, permanent (HCC) - Plan: EKG 12-Lead Persistent atrial fibrillation noted February 2019 Now permanent Long discussion concerning risk and benefit of anticoagulation Discussed side effects of Xarelto, Took herself off Eliquis secondary to bleeding Discussed several cases patients who have had stroke, one could not swallow, one could not speak, paralysis After long discussion she is willing to restart Eliquis 5 mg twice daily Rate is relatively well controlled Feels she is asymptomatic from her atrial fibrillation She does not want warfarin  Chronic low back pain, unspecified back pain laterality, unspecified whether sciatica present Chronic issues, does regular exercise walks 30 minutes twice a day  Smoker Stopped smoking 8 years ago Smoking cessation recommended  Centrilobular emphysema (HCC) Prior COPD exacerbation pneumonia February 2019 No recent COPD exacerbation  Disposition:   F/U  12 months   Total encounter time more than 25 minutes  Greater than 50% was spent in counseling and coordination of care with the patient    Orders Placed This Encounter  Procedures  . EKG 12-Lead     Signed, March 2019, M.D.,  Ph.D. 08/03/2019  [facility] [place], [place]

## 2019-08-03 ENCOUNTER — Ambulatory Visit: Payer: Medicare Other | Admitting: Cardiovascular Disease

## 2019-08-03 ENCOUNTER — Other Ambulatory Visit: Payer: Self-pay

## 2019-08-03 ENCOUNTER — Encounter: Payer: Self-pay | Admitting: Cardiovascular Disease

## 2019-08-03 VITALS — BP 130/76 | HR 83 | Ht 64.0 in | Wt 176.2 lb

## 2019-08-03 DIAGNOSIS — I4891 Unspecified atrial fibrillation: Secondary | ICD-10-CM | POA: Diagnosis not present

## 2019-08-03 DIAGNOSIS — J432 Centrilobular emphysema: Secondary | ICD-10-CM | POA: Diagnosis not present

## 2019-08-03 DIAGNOSIS — M545 Low back pain, unspecified: Secondary | ICD-10-CM

## 2019-08-03 DIAGNOSIS — F17200 Nicotine dependence, unspecified, uncomplicated: Secondary | ICD-10-CM | POA: Diagnosis not present

## 2019-08-03 DIAGNOSIS — G8929 Other chronic pain: Secondary | ICD-10-CM

## 2019-08-03 MED ORDER — APIXABAN 5 MG PO TABS: 5.0000 mg | ORAL_TABLET | Freq: Two times a day (BID) | ORAL | 11 refills | Status: DC

## 2019-08-03 NOTE — Patient Instructions (Signed)
Medication Instructions:  Please restart eliquis twice a day  If you need a refill on your cardiac medications before your next appointment, please call your pharmacy.    Lab work: No new labs needed   If you have labs (blood work) drawn today and your tests are completely normal, you will receive your results only by: Marland Kitchen MyChart Message (if you have MyChart) OR . A paper copy in the mail If you have any lab test that is abnormal or we need to change your treatment, we will call you to review the results.   Testing/Procedures: No new testing needed   Follow-Up: At [facility], you and your health needs are our priority.  As part of our continuing mission to provide you with exceptional heart care, we have created designated Provider Care Teams.  These Care Teams include your primary Cardiologist (physician) and Advanced Practice Providers (APPs -  Physician Assistants and Nurse Practitioners) who all work together to provide you with the care you need, when you need it.  . You will need a follow up appointment in 12 months   . Providers on your designated Care Team:   . Nicolasa Ducking, NP . Eula Listen, PA-C . Marisue Ivan, PA-C  Any Other Special Instructions Will Be Listed Below (If Applicable).  For educational health videos Log in to : [email or web address] Or : [email or web address], password : [email or web address]

## 2020-04-14 DIAGNOSIS — D751 Secondary polycythemia: Secondary | ICD-10-CM | POA: Insufficient documentation

## 2020-04-23 NOTE — Progress Notes (Incomplete)
04/24/2020  9:53 PM   Alicia Krause 1945/01/29 [id number]  Referring provider: Gracelyn Nurse, MD [address] [place],  [place] [address] No chief complaint on file.   HPI: Alicia Krause is a 75 y.o. female who presents today for evaluation and management of hematuria.   - UA from 04/07/2020 showed large blood, RBC 4-10, no WBC, few bacteria.  - She was recently seen by her PCP Dr. Letitia Libra on 04/14/2020.  - During wellness visit UA showed large blood, RBC 4-10, no WBC, rare bacteria. No associating urine culture.  - UA today shows ***  - Today *** - The patient has a history of smoking and quit 8 years ago.     1. Microscopic/Gross hematuria   We discussed the differential diagnosis for microscopic hematuria including nephrolithiasis, renal or upper tract tumors, bladder stones, UTIs, or bladder tumors as well as undetermined etiologies. Per AUA guidelines, I did recommend complete microscopic hematuria evaluation including CTU, possible urine cytology, and office cystoscopy.  (.nshematuria) >> blood in urine   PMH: Past Medical History:  Diagnosis Date  . Asthma   . COPD (chronic obstructive pulmonary disease) (HCC)    a. asthma and COPD developed ~ 2002 after moving to [place] from [place].  Marland Kitchen History of echocardiogram    a. 02/2017 Echo: EF 60-65%, no rwma, nl LA size, nl RV fxn. Nl PASP.  Marland Kitchen Hypothyroidism   . Permanent atrial fibrillation (HCC)    a. Prev failed flecainide; b. CHA2DDS2VASc -2-->Eliquis.    Surgical History: Past Surgical History:  Procedure Laterality Date  . AUGMENTATION MAMMAPLASTY Bilateral 1989   SILICONE - REPLACED IN 2003 WITH COMBO - RETROPECTORALIS    Home Medications:  Allergies as of 04/24/2020      Reactions   Latex Hives, Itching      Medication List       Accurate as of April 23, 2020  9:53 PM. If you have any questions, ask your nurse or doctor.        albuterol 108 (90 Base) MCG/ACT inhaler Commonly known as: VENTOLIN  HFA Inhale into the lungs.   apixaban 5 MG Tabs tablet Commonly known as: Eliquis Take 1 tablet (5 mg total) by mouth 2 (two) times daily.   carbidopa-levodopa 25-100 MG tablet Commonly known as: SINEMET IR Take 1 tablet by mouth daily.   levothyroxine 112 MCG tablet Commonly known as: SYNTHROID Take 112 mcg by mouth daily before breakfast.   oxyCODONE-acetaminophen 10-325 MG tablet Commonly known as: PERCOCET Take 1 tablet by mouth 3 (three) times daily.       Allergies:  Allergies  Allergen Reactions  . Latex Hives and Itching    Family History: Family History  Problem Relation Age of Onset  . Heart disease Mother   . Heart failure Mother   . Heart disease Father   . Heart failure Father   . Heart disease Sister   . Heart failure Sister   . Heart disease Brother   . Heart failure Brother   . Breast cancer Neg Hx     Social History:  reports that she quit smoking about 8 years ago. Her smoking use included cigarettes. She quit after 30.00 years of use. She has never used smokeless tobacco. She reports previous alcohol use. She reports current drug use. Drug: Oxycodone.   Physical Exam: There were no vitals taken for this visit.  Constitutional:  Alert and oriented, No acute distress. HEENT: [place] AT, moist mucus membranes.  Trachea midline, no masses. Cardiovascular: No clubbing, cyanosis, or edema. Respiratory: Normal respiratory effort, no increased work of breathing. Skin: No rashes, bruises or suspicious lesions. Neurologic: Grossly intact, no focal deficits, moving all 4 extremities. Psychiatric: Normal mood and affect.  Laboratory Data:  Lab Results  Component Value Date   CREATININE 0.67 03/06/2017    No results found for: HGBA1C  Urinalysis ***   Pertinent Imaging: ***    Assessment & Plan:     No follow-ups on file.  [facility] [address] [place], [place] [address] [phone]

## 2020-04-24 ENCOUNTER — Ambulatory Visit: Payer: Self-pay | Admitting: Urology

## 2020-05-04 ENCOUNTER — Ambulatory Visit: Payer: Medicare Other | Admitting: Urology

## 2020-05-04 ENCOUNTER — Other Ambulatory Visit: Payer: Self-pay

## 2020-05-04 ENCOUNTER — Encounter: Payer: Self-pay | Admitting: Urology

## 2020-05-04 VITALS — BP 147/88 | HR 81 | Ht 64.0 in | Wt 179.0 lb

## 2020-05-04 DIAGNOSIS — R310 Gross hematuria: Secondary | ICD-10-CM | POA: Diagnosis not present

## 2020-05-04 NOTE — Progress Notes (Signed)
05/04/2020 1:49 PM   Alicia Krause November 18, 1945 [id number]  Referring provider: Gracelyn Nurse, MD [address] [place],  [place] [address]  Chief Complaint  Patient presents with  . Hematuria    HPI: Alicia Krause is a 75 y.o. female referred for evaluation of microhematuria.   3 UAs since 06/2019 have shown 4-10 RBCs on microscopy, the last 2 performed March 2022  No bothersome LUTS  Denies dysuria, gross hematuria  Occasional pelvic cramping which occurs after increased activity  Prior history of stone disease though none past 15-20 years  On Eliquis ~ 2 years   PMH: Past Medical History:  Diagnosis Date  . Asthma   . COPD (chronic obstructive pulmonary disease) (HCC)    a. asthma and COPD developed ~ 2002 after moving to [place] from [place].  Marland Kitchen History of echocardiogram    a. 02/2017 Echo: EF 60-65%, no rwma, nl LA size, nl RV fxn. Nl PASP.  Marland Kitchen Hypothyroidism   . Permanent atrial fibrillation (HCC)    a. Prev failed flecainide; b. CHA2DDS2VASc -2-->Eliquis.    Surgical History: Past Surgical History:  Procedure Laterality Date  . AUGMENTATION MAMMAPLASTY Bilateral 1989   SILICONE - REPLACED IN 2003 WITH COMBO - RETROPECTORALIS    Home Medications:  Allergies as of 05/04/2020      Reactions   Latex Hives, Itching      Medication List       Accurate as of May 04, 2020  1:49 PM. If you have any questions, ask your nurse or doctor.        albuterol 108 (90 Base) MCG/ACT inhaler Commonly known as: VENTOLIN HFA Inhale into the lungs.   apixaban 5 MG Tabs tablet Commonly known as: Eliquis Take 1 tablet (5 mg total) by mouth 2 (two) times daily.   carbidopa-levodopa 25-100 MG tablet Commonly known as: SINEMET IR Take 1 tablet by mouth daily.   levothyroxine 112 MCG tablet Commonly known as: SYNTHROID Take 112 mcg by mouth daily before breakfast.   oxyCODONE-acetaminophen 10-325 MG tablet Commonly known as: PERCOCET Take 1 tablet by mouth 3  (three) times daily.       Allergies:  Allergies  Allergen Reactions  . Latex Hives and Itching    Family History: Family History  Problem Relation Age of Onset  . Heart disease Mother   . Heart failure Mother   . Heart disease Father   . Heart failure Father   . Heart disease Sister   . Heart failure Sister   . Heart disease Brother   . Heart failure Brother   . Breast cancer Neg Hx     Social History:  reports that she quit smoking about 8 years ago. Her smoking use included cigarettes. She quit after 30.00 years of use. She has never used smokeless tobacco. She reports previous alcohol use. She reports current drug use. Drug: Oxycodone.   Physical Exam: BP (!) 147/88   Pulse 81   Ht 5\' 4"  (1.626 m)   Wt 179 lb (81.2 kg)   BMI 30.73 kg/m   Constitutional:  Alert and oriented, No acute distress. HEENT: [place] AT, moist mucus membranes.  Trachea midline, no masses. Cardiovascular: No clubbing, cyanosis, or edema. Respiratory: Normal respiratory effort, no increased work of breathing. Skin: No rashes, bruises or suspicious lesions. Neurologic: Grossly intact, no focal deficits, moving all 4 extremities. Psychiatric: Normal mood and affect.   Assessment & Plan:    1.  Microscopic hematuria  AUA hematuria  risk stratification: High  We discussed the recommend evaluation for high risk hematuria to include CT urogram and cystoscopy.  The procedures were discussed in detail and recommended scheduling  She inquired if she chose not to undergo the evaluation and was informed that potential urologic malignancies could go undetected with potential risk of metastasis  She would like to think this over and indicates she would call back with her decision    Riki Altes, MD  [facility] [address] [place], [place] [address] [phone]

## 2020-05-07 ENCOUNTER — Encounter: Payer: Self-pay | Admitting: Urology

## 2020-05-08 ENCOUNTER — Telehealth: Payer: Self-pay

## 2020-05-08 DIAGNOSIS — R3129 Other microscopic hematuria: Secondary | ICD-10-CM

## 2020-05-08 LAB — MICROSCOPIC EXAMINATION

## 2020-05-08 LAB — URINALYSIS, COMPLETE
Bilirubin, UA: NEGATIVE
Glucose, UA: NEGATIVE
Leukocytes,UA: NEGATIVE
Nitrite, UA: NEGATIVE
Specific Gravity, UA: >1.03 — ABNORMAL HIGH (ref 1.005–1.030)
Urobilinogen, Ur: 0.2 mg/dL (ref 0.2–1.0)
pH, UA: 5 (ref 5.0–7.5)

## 2020-05-08 NOTE — Telephone Encounter (Signed)
Patient called stating she would like to go ahead with the CT scan

## 2020-05-09 NOTE — Telephone Encounter (Signed)
Patient would not like the cystoscopy done at this time. I advised her we will call with results.

## 2020-05-09 NOTE — Telephone Encounter (Signed)
CT urogram order entered.  Does she want to schedule cystoscopy?

## 2020-05-29 ENCOUNTER — Other Ambulatory Visit: Payer: Self-pay

## 2020-05-29 ENCOUNTER — Ambulatory Visit
Admission: RE | Admit: 2020-05-29 | Discharge: 2020-05-29 | Disposition: A | Discharge status: Home / Self Care | Payer: Medicare Other | Source: Ambulatory Visit | Attending: Urology | Admitting: Urology

## 2020-05-29 DIAGNOSIS — R3129 Other microscopic hematuria: Secondary | ICD-10-CM | POA: Diagnosis present

## 2020-05-29 LAB — POCT I-STAT CREATININE: Creatinine, Ser: 0.9 mg/dL (ref 0.44–1.00)

## 2020-05-29 MED ORDER — IOHEXOL 300 MG/ML  SOLN
125.0000 mL | Freq: Once | INTRAMUSCULAR | Status: AC | PRN
  Administered 2020-05-29: 125 mL via INTRAVENOUS

## 2020-06-12 ENCOUNTER — Telehealth: Payer: Self-pay | Admitting: *Deleted

## 2020-06-12 NOTE — Telephone Encounter (Signed)
Notified patient as instructed,.  

## 2020-06-12 NOTE — Telephone Encounter (Signed)
-----   Message from Riki Altes, MD sent at 06/11/2020 10:48 AM EDT ----- CT showed no upper tract abnormalities.  Recommend cystoscopy to evaluate for bladder mucosal abnormalities.  This is the recommended evaluation as bladder abnormalities are not well identified on CT.

## 2020-07-14 NOTE — Addendum Note (Signed)
Encounter addended by: Novella Olive on: 07/14/2020 5:58 PM  Actions taken: Letter saved

## 2020-07-17 ENCOUNTER — Encounter: Payer: Self-pay | Admitting: Urology

## 2020-07-17 ENCOUNTER — Other Ambulatory Visit: Payer: Self-pay

## 2020-07-17 ENCOUNTER — Ambulatory Visit (INDEPENDENT_AMBULATORY_CARE_PROVIDER_SITE_OTHER): Payer: Medicare Other | Admitting: Urology

## 2020-07-17 VITALS — BP 132/82 | HR 92 | Ht 64.0 in | Wt 179.0 lb

## 2020-07-17 DIAGNOSIS — R310 Gross hematuria: Secondary | ICD-10-CM | POA: Diagnosis not present

## 2020-07-17 NOTE — Progress Notes (Signed)
   07/17/20  CC:  Chief Complaint  Patient presents with   Cysto    HPI: 75 y.o. female with high risk microhematuria.  CTU showed no upper tract abnormalities.  NED. A&Ox3.   No respiratory distress   Abd soft, NT, ND Normal external genitalia with patent urethral meatus  Cystoscopy Procedure Note  Patient identification was confirmed, informed consent was obtained, and patient was prepped using Betadine solution.  Lidocaine jelly was administered per urethral meatus.    Procedure: - Flexible cystoscope introduced, without any difficulty.   - Thorough search of the bladder revealed:    normal urethral meatus    normal urothelium    no stones    no ulcers     no tumors    no urethral polyps    no trabeculation  - Ureteral orifices were normal in position and appearance.  Post-Procedure: - Patient tolerated the procedure well  Assessment/ Plan: No mucosal abnormalities on cystoscopy With negative upper tract imaging and negative cystoscopy she was reassured the microhematuria is unlikely significant Recommend annual to semiannual UA with Dr. Letitia Libra and to return as needed for any significant increase in hematuria Was also incidentally noted to have a thickened endometrial canal and pelvic ultrasound was suggested.  Will defer to PCP/gynecology.  I messaged Dr. Letitia Libra.   Riki Altes, MD

## 2020-07-18 LAB — URINALYSIS, COMPLETE
Bilirubin, UA: NEGATIVE
Glucose, UA: NEGATIVE
Leukocytes,UA: NEGATIVE
Nitrite, UA: NEGATIVE
Specific Gravity, UA: >1.03 — ABNORMAL HIGH (ref 1.005–1.030)
Urobilinogen, Ur: 0.2 mg/dL (ref 0.2–1.0)
pH, UA: 5 (ref 5.0–7.5)

## 2020-07-18 LAB — MICROSCOPIC EXAMINATION: Epithelial Cells (non renal): >10 /hpf — AB (ref 0–10)

## 2020-08-01 DIAGNOSIS — K449 Diaphragmatic hernia without obstruction or gangrene: Secondary | ICD-10-CM | POA: Insufficient documentation

## 2020-08-01 DIAGNOSIS — R9389 Abnormal findings on diagnostic imaging of other specified body structures: Secondary | ICD-10-CM | POA: Insufficient documentation

## 2020-10-09 ENCOUNTER — Other Ambulatory Visit: Payer: Self-pay | Admitting: Cardiovascular Disease

## 2020-10-09 ENCOUNTER — Telehealth: Payer: Self-pay | Admitting: Cardiovascular Disease

## 2020-10-09 NOTE — Telephone Encounter (Signed)
Scheduled

## 2020-10-09 NOTE — Telephone Encounter (Signed)
Refill request

## 2020-10-09 NOTE — Telephone Encounter (Signed)
-----   Message from [place], [place] sent at 10/09/2020  8:34 AM EDT ----- Regarding: [facility] APPT PT OVERDUE TO SEE [facility] PLEASE TRY TO SCHEDULE ASAP

## 2020-10-09 NOTE — Telephone Encounter (Signed)
PT OVERDUE TO SEE [facility]LLAN SO I WILL GRANT 1 MONTH REFILL AND SEND MSG TO SCHEDULERS TO SCHEDULE MD APPT  Prescription refill request for Eliquis received. Indication:AFIB Last office visit:GOLLAN 08/03/19 OVERDUE Scr:0.8 07/14/20 Age: 48F Weight:81.2KG

## 2020-10-10 ENCOUNTER — Other Ambulatory Visit: Payer: Self-pay

## 2020-10-10 ENCOUNTER — Encounter: Payer: Self-pay | Admitting: Cardiovascular Disease

## 2020-10-10 ENCOUNTER — Ambulatory Visit: Payer: Medicare Other | Admitting: Cardiovascular Disease

## 2020-10-10 VITALS — BP 138/62 | HR 85 | Ht 64.0 in | Wt 183.1 lb

## 2020-10-10 DIAGNOSIS — J432 Centrilobular emphysema: Secondary | ICD-10-CM | POA: Diagnosis not present

## 2020-10-10 DIAGNOSIS — F17200 Nicotine dependence, unspecified, uncomplicated: Secondary | ICD-10-CM | POA: Diagnosis not present

## 2020-10-10 DIAGNOSIS — I4821 Permanent atrial fibrillation: Secondary | ICD-10-CM | POA: Diagnosis not present

## 2020-10-10 MED ORDER — APIXABAN 5 MG PO TABS: 5.0000 mg | ORAL_TABLET | Freq: Two times a day (BID) | ORAL | 3 refills | Status: DC

## 2020-10-10 MED ORDER — FUROSEMIDE 20 MG PO TABS
20.0000 mg | ORAL_TABLET | ORAL | 3 refills | Status: DC | PRN
Start: 2020-10-10 — End: 2022-12-10

## 2020-10-10 MED ORDER — POTASSIUM CHLORIDE ER 10 MEQ PO TBCR: 10.0000 meq | EXTENDED_RELEASE_TABLET | ORAL | 3 refills | Status: DC | PRN

## 2020-10-10 NOTE — Progress Notes (Signed)
Cardiology Office Note  Date:  10/10/2020   ID:  Alicia Krause, DOB 1945/02/17, MRN [id number]  PCP:  Gracelyn Nurse, MD   Chief Complaint  Patient presents with   12 month follow up     Patient c/o shortness of breath and rapid heartbeats at times. Medications reviewed by the patient verbally.     HPI:  Alicia Krause is a 75 year old woman with past medical history of Persistent atrial fibrillation since February 2019 Prior smoking history COPD exacerbation February 2019 with pneumonia, atrial fib Severe chronic back pain, s/p surgery, additional pain medication Who presents for f/u of her atrial fibrillation  Seen in clinic by myself July 2021 Feels well, no complaints Tolerating Eliquis Exercising on a regular basis, trying to lose weight Denies shortness of breath Has occasional abdominal fullness, feels bloated High water intake  Back stable, but has chronic pain Still bothering her today  Denies shortness of breath on exertion  EKG personally reviewed by myself on todays visit Shows atrial fibrillation with ventricular rate 85 bpm no significant ST or T wave change   Other past medical history reviewed hospitalized 03/04/2017 with COPD exacerbation and pneumonia.   in atrial fibrillation with rapid ventricular rate.   2D echocardiogram was performed 03/05/2017 which revealed normal left ventricular function, with LVEF of 60-65%.   24-hour Holter monitor revealed predominant atrial fibrillation with a mean heart rate of 71 bpm.   EKG performed 04/2017 showed atrial fibrillation    trial of flecainide 50 mg started on 03/31/17.   frequent dizzy spells and tremors.     PMH:   has a past medical history of Asthma, COPD (chronic obstructive pulmonary disease) (HCC), History of echocardiogram, Hypothyroidism, and Permanent atrial fibrillation (HCC).  PSH:    Past Surgical History:  Procedure Laterality Date   AUGMENTATION MAMMAPLASTY Bilateral 1989   SILICONE -  REPLACED IN 2003 WITH COMBO - RETROPECTORALIS    Current Outpatient Medications  Medication Sig Dispense Refill   albuterol (PROVENTIL HFA;VENTOLIN HFA) 108 (90 Base) MCG/ACT inhaler Inhale into the lungs.     apixaban (ELIQUIS) 5 MG TABS tablet Take 1 tablet (5 mg total) by mouth 2 (two) times daily. MUST MAKE AN APPOINTMENT WITH CARDIOLOGIST FOR FURTHER REFILLS 60 tablet 0   carbidopa-levodopa (SINEMET IR) 25-100 MG tablet Take 1 tablet by mouth daily.   10   levothyroxine (SYNTHROID, LEVOTHROID) 112 MCG tablet Take 112 mcg by mouth daily before breakfast.   0   oxyCODONE-acetaminophen (PERCOCET) 10-325 MG tablet Take 1 tablet by mouth 3 (three) times daily.  0   No current facility-administered medications for this visit.     Allergies:   Latex   Social History:  The patient  reports that she quit smoking about 8 years ago. Her smoking use included cigarettes. She has never used smokeless tobacco. She reports that she does not currently use alcohol. She reports current drug use. Drug: Oxycodone.   Family History:   family history includes Heart disease in her brother, father, mother, and sister; Heart failure in her brother, father, mother, and sister.    Review of Systems: Review of Systems  Constitutional: Negative.   HENT:         Gum bleeding  Respiratory: Negative.    Cardiovascular: Negative.   Gastrointestinal: Negative.   Musculoskeletal:  Positive for back pain.  Neurological: Negative.   Psychiatric/Behavioral: Negative.    All other systems reviewed and are negative.   PHYSICAL EXAM: VS:  BP 138/62 (BP Location: Left Arm, Patient Position: Sitting, Cuff Size: Normal)   Pulse 85   Ht 5\' 4"  (1.626 m)   Wt 183 lb 2 oz (83.1 kg)   SpO2 96%   BMI 31.43 kg/m  , BMI Body mass index is 31.43 kg/m. Constitutional:  oriented to person, place, and time. No distress.  HENT:  Head: Grossly normal Eyes:  no discharge. No scleral icterus.  Neck: No JVD, no carotid  bruits  Cardiovascular: Regular rate and rhythm, no murmurs appreciated Pulmonary/Chest: Clear to auscultation bilaterally, no wheezes or rails Abdominal: Soft.  no distension.  no tenderness.  Musculoskeletal: Normal range of motion Neurological:  normal muscle tone. Coordination normal. No atrophy Skin: Skin warm and dry Psychiatric: normal affect, pleasant  Recent Labs: 05/29/2020: Creatinine, Ser 0.90    Lipid Panel No results found for: CHOL, HDL, LDLCALC, TRIG    Wt Readings from Last 3 Encounters:  10/10/20 183 lb 2 oz (83.1 kg)  07/17/20 179 lb (81.2 kg)  05/04/20 179 lb (81.2 kg)     ASSESSMENT AND PLAN:  Atrial fibrillation, permanent (HCC) - Plan: EKG 12-Lead Persistent atrial fibrillation noted February 2019 Now permanent Tolerating Eliquis 5 twice daily, refilled Rate well controlled May have some fluid retention from the atrial fibrillation, recommended Lasix 20 with potassium 10 as needed for abdominal distention/bloating  Chronic low back pain, unspecified back pain laterality, unspecified whether sciatica present Chronic issues,  Still with chronic pain Able to do some exercises  Smoker Stopped smoking 8 years ago  Centrilobular emphysema (HCC) Prior COPD exacerbation pneumonia February 2019 No recent COPD exacerbation  Aortic atherosclerosis Seen on CT scan 2019, minimal in the aorta No significant coronary calcification noted but difficult to visualize   Total encounter time more than 25 minutes  Greater than 50% was spent in counseling and coordination of care with the patient    No orders of the defined types were placed in this encounter.    Signed, 2020, M.D., Ph.D. 10/10/2020  [facility] [place], [place]

## 2020-10-10 NOTE — Patient Instructions (Addendum)
Medication Instructions:  Please take  lasix 20 mg daily as needed with potassium 10 meq daily PRN with lasix  For ABD bloating/swelling  If you need a refill on your cardiac medications before your next appointment, please call your pharmacy.   Lab work: No new labs needed  Testing/Procedures: No new testing needed  Follow-Up: At [facility], you and your health needs are our priority.  As part of our continuing mission to provide you with exceptional heart care, we have created designated Provider Care Teams.  These Care Teams include your primary Cardiologist (physician) and Advanced Practice Providers (APPs -  Physician Assistants and Nurse Practitioners) who all work together to provide you with the care you need, when you need it.  You will need a follow up appointment in 12 months  Providers on your designated Care Team:   Nicolasa Ducking, NP Eula Listen, PA-C Marisue Ivan, PA-C [place], [place]  COVID-19 Vaccine Information can be found at: [email or web address] For questions related to vaccine distribution or appointments, please email [email or web address] or call [phone].

## 2020-10-27 DIAGNOSIS — Z23 Encounter for immunization: Secondary | ICD-10-CM

## 2021-10-23 ENCOUNTER — Other Ambulatory Visit: Payer: Self-pay | Admitting: Cardiovascular Disease

## 2021-10-23 DIAGNOSIS — I4821 Permanent atrial fibrillation: Secondary | ICD-10-CM

## 2021-10-23 NOTE — Telephone Encounter (Signed)
Refill request

## 2021-10-23 NOTE — Telephone Encounter (Signed)
Prescription refill request for Eliquis received. Indication: Afib  Last office visit:10/10/20 ([facility])  Scr: 0.9 (10/05/21)  Age: 76 Weight: 83.1kg  Overdue to see cardiologist. Message sent to schedulers. Refill sent to last until upcoming appt.

## 2021-10-24 ENCOUNTER — Telehealth: Payer: Self-pay | Admitting: Cardiovascular Disease

## 2021-10-24 NOTE — Telephone Encounter (Signed)
Patient states she is the donut hole & is needing assistance, Eliquis. Please assist.

## 2021-10-24 NOTE — Telephone Encounter (Signed)
-  Pt called stating she is currently in the doughnut hole and unable to afford Eliquis. -She report currently cost is $400 and questioning if there are any other affordable options.  -Pt report she roughly has week left. -Pt has an appointment scheduled with Sherri Hammock 10/6. Nurse advised pt to keep appointment to discuss options.

## 2021-10-24 NOTE — Telephone Encounter (Signed)
Added to 10/26/21 appt notes that Eliquis cost/PAP needs to be discussed

## 2021-10-25 NOTE — Telephone Encounter (Signed)
Spoke with patient and reviewed options for patient assistance. She strongly feels that they will not qualify for assistance due to income. We discussed requirements in detail to include income and necessary documents. She verbalized understanding and will discuss with provider at her appointment tomorrow. No further needs at this time.

## 2021-10-26 ENCOUNTER — Ambulatory Visit: Payer: Medicare Other | Attending: Cardiology | Admitting: Cardiology

## 2021-10-26 ENCOUNTER — Encounter: Payer: Self-pay | Admitting: Cardiology

## 2021-10-26 VITALS — BP 136/72 | HR 88 | Ht 64.0 in | Wt 184.0 lb

## 2021-10-26 DIAGNOSIS — J432 Centrilobular emphysema: Secondary | ICD-10-CM

## 2021-10-26 DIAGNOSIS — I700 Atherosclerosis of aorta: Secondary | ICD-10-CM

## 2021-10-26 DIAGNOSIS — N950 Postmenopausal bleeding: Secondary | ICD-10-CM | POA: Insufficient documentation

## 2021-10-26 DIAGNOSIS — I4821 Permanent atrial fibrillation: Secondary | ICD-10-CM | POA: Diagnosis not present

## 2021-10-26 NOTE — Progress Notes (Signed)
Cardiology Clinic Note   Patient Name: Alicia Krause Date of Encounter: 10/26/2021  Primary Care Provider:  Baxter Hire, MD Primary Cardiologist:  Ida Rogue, MD  Patient Profile    76 year old female with a past medical history of persistent atrial fibrillation since February 2019, prior smoking history, COPD, pneumonia, severe chronic back pain status post surgery, who is here today for follow-up of her atrial fibrillation.  Past Medical History    Past Medical History:  Diagnosis Date   Asthma    COPD (chronic obstructive pulmonary disease) ([place])    a. asthma and COPD developed ~ 2002 after moving to [place] from [place].   History of echocardiogram    a. 02/2017 Echo: EF 60-65%, no rwma, nl LA size, nl RV fxn. Nl PASP.   Hypothyroidism    Permanent atrial fibrillation ([place])    a. Prev failed flecainide; b. CHA2DDS2VASc -2-->Eliquis.   Past Surgical History:  Procedure Laterality Date   AUGMENTATION MAMMAPLASTY Bilateral [id number]   SILICONE - REPLACED IN 2003 WITH COMBO - RETROPECTORALIS    Allergies  Allergies  Allergen Reactions   Latex Hives and Itching    History of Present Illness    Cerenity Herda is a 76 year old female with the previously mentioned past medical history of persistent atrial fibrillation since February 2019 on apixaban, prior smoking history, COPD, pneumonia, severe chronic back pain status post surgery.  She said history of persistent atrial fibrillation since February 2019 when she was having a COPD exacerbation with pneumonia.  She previously been placed on Xarelto but had to stop the past secondary to bleeding and side effects.  At that time she was changed to apixaban.  Last visit she had reported some gum bleeding which subsequently resolved early on.  She had a trial of flecainide 50 mg started on 03/31/2017 but had dizzy spells and tremors medication had to be subsequently stopped.  Previous 2D echocardiogram was performed 03/05/2017 which  revealed normal left ventricular function with an LVEF of 60-65%.  24-hour Holter monitor revealed predominant atrial fibrillation with a mean heart rate of 71 bpm.  She was last seen in clinic 10/10/2020 by Dr. Rockey Situ at that time she was doing presumably well.Had some complaints of shortness of breath or rapid heartbeats at times there were no medication changes that were made to her regimen  She returns to clinic today stating that she has been doing fairly well.  She denies any chest pain, palpitations, peripheral edema.  She does endorse worsening shortness of breath and dyspnea on exertion as well as conversational dyspnea on exertion.  She has longstanding history of asthma as well as been having issues with her asthma as of late.  She denies any recent hospitalizations or visits to the emergency department.  She also has concerns today of being in the donut hole and not being able to afford her Eliquis.  So she did bring in paperwork for patient's assistance.  She said at this point she is unable to get assistance for Eliquis that she will have have to stop taking her anticoagulation to be able to afford her asthma medication which is more important to her.  Home Medications    Current Outpatient Medications  Medication Sig Dispense Refill   albuterol (PROVENTIL HFA;VENTOLIN HFA) 108 (90 Base) MCG/ACT inhaler Inhale 2 puffs into the lungs every 6 (six) hours as needed for wheezing or shortness of breath.     apixaban (ELIQUIS) 5 MG TABS tablet TAKE 1  TABLET(5 MG) BY MOUTH TWICE DAILY (Patient taking differently: Take 5 mg by mouth 2 (two) times daily.) 180 tablet 0   carbidopa-levodopa (SINEMET IR) 25-100 MG tablet Take 1 tablet by mouth daily.   10   furosemide (LASIX) 20 MG tablet Take 1 tablet (20 mg total) by mouth as needed. For abdominal swelling, edema, or shortness of breath (Patient taking differently: Take 20 mg by mouth as needed for edema (AND SOB). For abdominal swelling, edema, or  shortness of breath) 90 tablet 3   levothyroxine (SYNTHROID, LEVOTHROID) 112 MCG tablet Take 112 mcg by mouth daily before breakfast.   0   oxyCODONE-acetaminophen (PERCOCET) 10-325 MG tablet Take 1 tablet by mouth 3 (three) times daily.  0   potassium chloride (KLOR-CON) 10 MEQ tablet Take 1 tablet (10 mEq total) by mouth as needed. Take with lasix (Patient taking differently: Take 10 mEq by mouth as needed (SEE BELOW). Take with lasix) 90 tablet 3   No current facility-administered medications for this visit.     Family History    Family History  Problem Relation Age of Onset   Heart disease Mother    Heart failure Mother    Heart disease Father    Heart failure Father    Heart disease Sister    Heart failure Sister    Heart disease Brother    Heart failure Brother    Breast cancer Neg Hx    She indicated that her mother is deceased. She indicated that her father is deceased. She indicated that her sister is deceased. She indicated that her brother is deceased. She indicated that the status of her neg hx is unknown.  Social History    Social History   Socioeconomic History   Marital status: Married    Spouse name: Not on file   Number of children: Not on file   Years of education: Not on file   Highest education level: Not on file  Occupational History   Not on file  Tobacco Use   Smoking status: Former    Years: 30.00    Types: Cigarettes    Quit date: 2014    Years since quitting: 9.7   Smokeless tobacco: Never  Vaping Use   Vaping Use: Never used  Substance and Sexual Activity   Alcohol use: Not Currently   Drug use: Yes    Types: Oxycodone   Sexual activity: Not on file  Other Topics Concern   Not on file  Social History Narrative   Not on file   Social Determinants of Health   Financial Resource Strain: Not on file  Food Insecurity: Not on file  Transportation Needs: Not on file  Physical Activity: Not on file  Stress: Not on file  Social  Connections: Not on file  Intimate Partner Violence: Not on file     Review of Systems    General:  No chills, fever, night sweats or weight changes.  Cardiovascular:  No chest pain, endorses  dyspnea on exertion, but denies edema, orthopnea, palpitations, paroxysmal nocturnal dyspnea. Dermatological: No rash, lesions/masses Respiratory: No cough, endorses dyspnea on exertion and with a conversation Urologic: No hematuria, dysuria Abdominal:   No nausea, vomiting, diarrhea, bright red blood per rectum, melena, or hematemesis Neurologic:  No visual changes, wkns, changes in mental status. All other systems reviewed and are otherwise negative except as noted above.     Physical Exam    VS:  BP 136/72   Pulse 88  Ht 5\' 4"  (1.626 m)   Wt 184 lb (83.5 kg)   SpO2 93%   BMI 31.58 kg/m  , BMI Body mass index is 31.58 kg/m.     GEN: Well nourished, well developed, in no acute distress. HEENT: normal. Neck: Supple, no JVD, carotid bruits, or masses. Cardiac: Irregularly irregular, no murmurs, rubs, or gallops. No clubbing, cyanosis, edema.  Radials/DP/PT 2+ and equal bilaterally.  Respiratory:  Respirations regular and unlabored, post expiratory wheezes to auscultation bilaterally.  She is sitting in the exam room with a hand-held fan. GI: Soft, nontender, nondistended, BS + x 4. MS: no deformity or atrophy. Skin: warm and dry, no rash. Neuro:  Strength and sensation are intact. Psych: Normal affect.  Accessory Clinical Findings    ECG personally reviewed by me today-rate controlled atrial fibrillation- No acute changes  Lab Results  Component Value Date   WBC 13.9 (H) 03/06/2017   HGB 14.1 03/06/2017   HCT 42.9 03/06/2017   MCV 97.1 03/06/2017   PLT 388 03/06/2017   Lab Results  Component Value Date   CREATININE 0.90 05/29/2020   BUN 14 03/06/2017   NA 143 03/06/2017   K 3.8 03/06/2017   CL 103 03/06/2017   CO2 28 03/06/2017   Lab Results  Component Value Date    ALT 43 03/04/2017   AST 55 (H) 03/04/2017   ALKPHOS 112 03/04/2017   BILITOT 1.1 03/04/2017   No results found for: "CHOL", "HDL", "LDLCALC", "LDLDIRECT", "TRIG", "CHOLHDL"  No results found for: "HGBA1C"  Assessment & Plan   1.  Permanent atrial fibrillation that is rate controlled with no beta-blocker agents.  CHA2DS2-VASc of at least 2. she has been tolerating apixaban 5 mg twice daily.  She did bring in paperwork today for assistance with the apixaban as she was unable to afford the medication currently she is in the donut hole.   2.  Aortic atherosclerosis seen on CT scan in 2019, minimal in the aorta.  Significant coronary calcification noted but difficult to visualize.  On apixaban in place of aspirin.  3.  Centrilobular emphysema with longstanding history of asthma.  Prior COPD exacerbation with pneumonia in February 2019.  No recent exacerbations.  She does continue to complain of shortness of breath and dyspnea on exertion and conversational dyspnea.  Echocardiogram to be ordered as her last study was done in 2019.  Study revealed LVEF 60-65%, no regional wall motion abnormalities, and no valvular abnormalities were noted.  Patient states she will look at her husband's schedule and call back to reschedule her test.  4.  Disposition patient return to clinic to see MD/APP in 11 months with an EKG on return or sooner if needed  Johniya Durfee, NP 10/26/2021, 3:47 PM  .

## 2021-10-26 NOTE — Patient Instructions (Signed)
Medication Instructions:  No changes at this time.   *If you need a refill on your cardiac medications before your next appointment, please call your pharmacy*   Lab Work: None  If you have labs (blood work) drawn today and your tests are completely normal, you will receive your results only by: [place] (if you have MyChart) OR A paper copy in the mail If you have any lab test that is abnormal or we need to change your treatment, we will call you to review the results.   Testing/Procedures: Your physician has requested that you have an echocardiogram. Echocardiography is a painless test that uses sound waves to create images of your heart. It provides your doctor with information about the size and shape of your heart and how well your heart's chambers and valves are working. This procedure takes approximately one hour. There are no restrictions for this procedure.    Follow-Up: At [facility], you and your health needs are our priority.  As part of our continuing mission to provide you with exceptional heart care, we have created designated Provider Care Teams.  These Care Teams include your primary Cardiologist (physician) and Advanced Practice Providers (APPs -  Physician Assistants and Nurse Practitioners) who all work together to provide you with the care you need, when you need it.   Your next appointment:   1 year(s)  The format for your next appointment:   In Person  Provider:   Ida Rogue, MD or Gerrie Nordmann, NP    Other Instructions Medication Samples have been provided to the patient.  Drug name: Eliquis        Strength: 5 mg         Qty: 4 boxes   LOT: [id number]   Exp.Date: May 2025   Important Information About Sugar

## 2021-10-29 ENCOUNTER — Other Ambulatory Visit: Payer: Self-pay | Admitting: *Deleted

## 2021-10-29 DIAGNOSIS — I4821 Permanent atrial fibrillation: Secondary | ICD-10-CM

## 2021-10-29 MED ORDER — APIXABAN 5 MG PO TABS: 5.0000 mg | ORAL_TABLET | Freq: Two times a day (BID) | ORAL | 3 refills | Status: DC

## 2021-11-02 ENCOUNTER — Other Ambulatory Visit: Payer: Self-pay

## 2021-11-02 DIAGNOSIS — I4821 Permanent atrial fibrillation: Secondary | ICD-10-CM

## 2021-11-02 MED ORDER — APIXABAN 5 MG PO TABS: 5.0000 mg | ORAL_TABLET | Freq: Two times a day (BID) | ORAL | 1 refills | Status: DC

## 2022-01-24 IMAGING — CT CT ABD-PEL WO/W CM
3 of 12 series · 11 of 46 positions shown, 17 images · IV contrast (omnipaque)
Comparison: CT chest March 04, 2017.

CLINICAL DATA: Chronic microscopic hematuria.

EXAM:
CT ABDOMEN AND PELVIS WITHOUT AND WITH CONTRAST
TECHNIQUE: Multidetector CT imaging of the abdomen and pelvis was performed
following the standard protocol before and following the bolus
administration of intravenous contrast.
CONTRAST:  125mL OMNIPAQUE IOHEXOL 300 MG/ML  SOLN

[Series 2: abd without pre 5.00 · axial · non-contrast · 0.71mm/px · z∈[-1543,-1478]mm · 2 of 89 slices shown]
[im 13/89  soft-tissue]
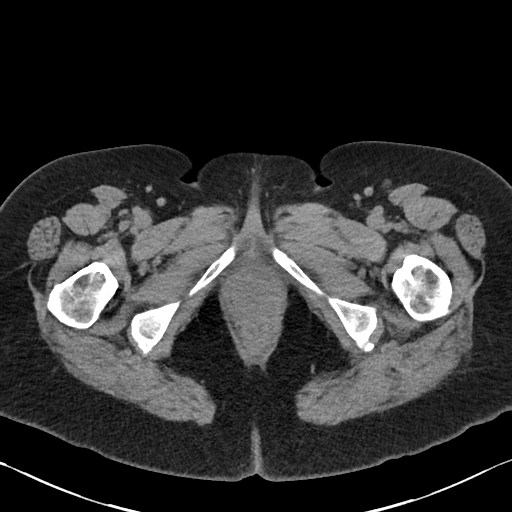
[im 26/89  soft-tissue]
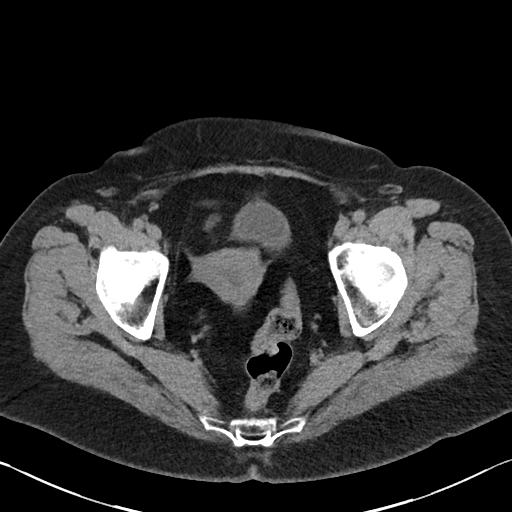

[Series 18: axial delay delay left lateral 5.00 · axial · delayed · 0.71mm/px · z∈[-1463,-1118]mm · 7 of 93 slices shown, 12 images]
[im 12/93  soft-tissue]
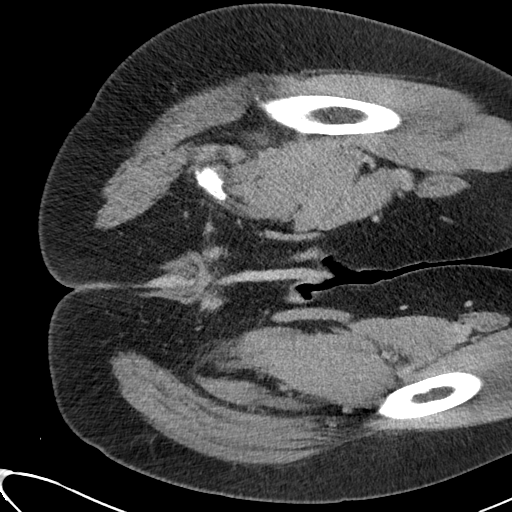
[im 12/93  bone]
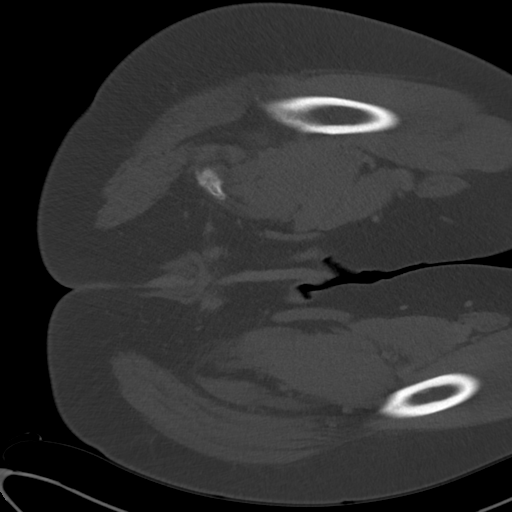
[im 24/93  soft-tissue]
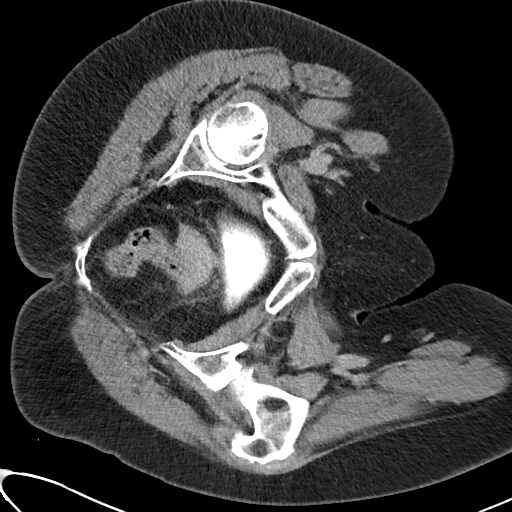
[im 35/93  soft-tissue]
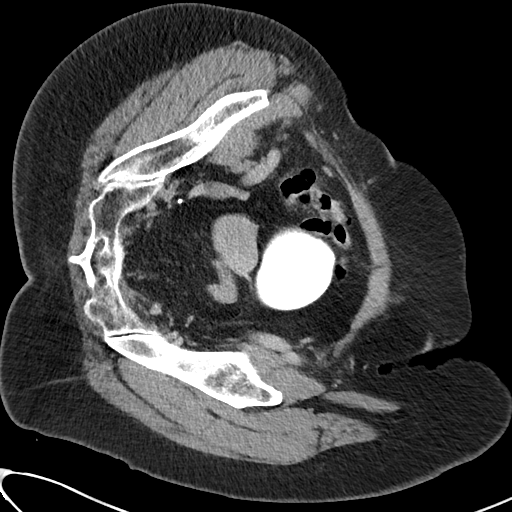
[im 47/93  soft-tissue]
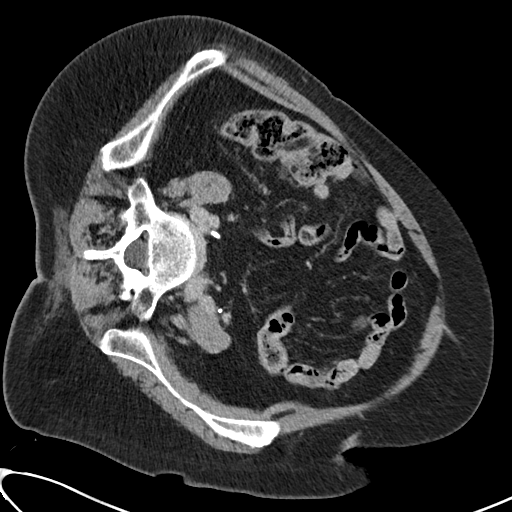
[im 47/93  lung]
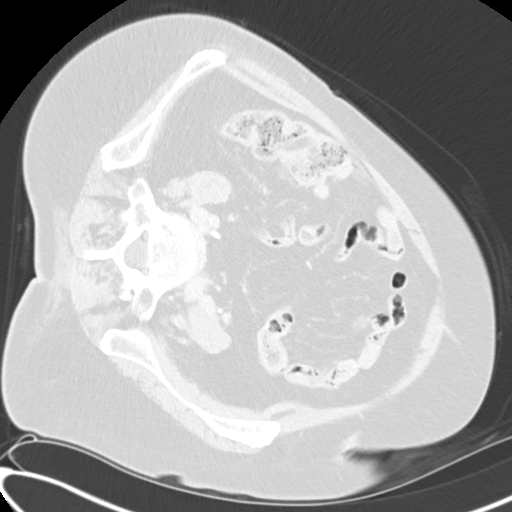
[im 58/93  soft-tissue]
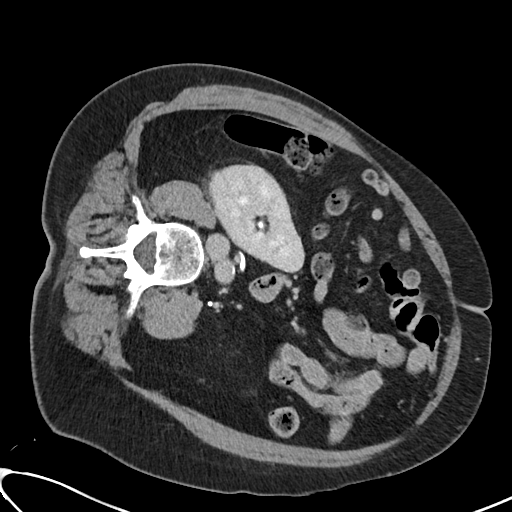
[im 58/93  lung]
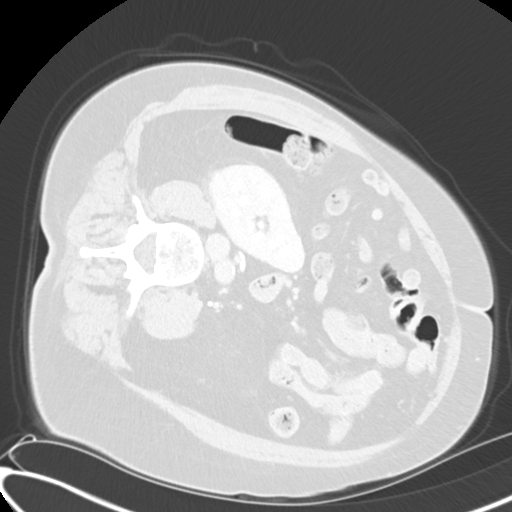
[im 70/93  soft-tissue]
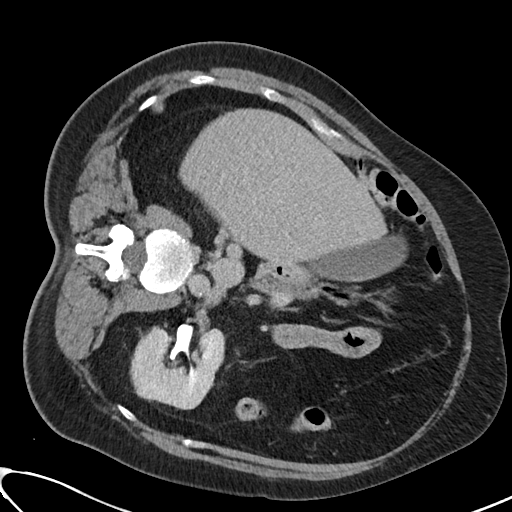
[im 70/93  lung]
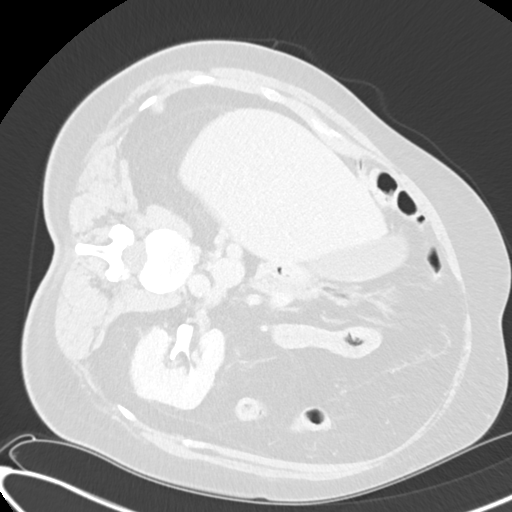
[im 81/93  soft-tissue]
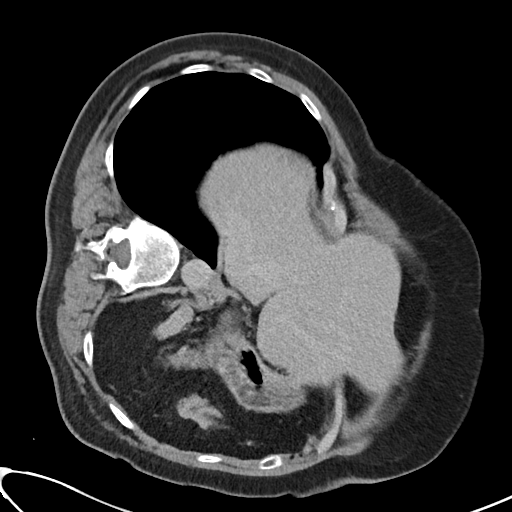
[im 81/93  lung]
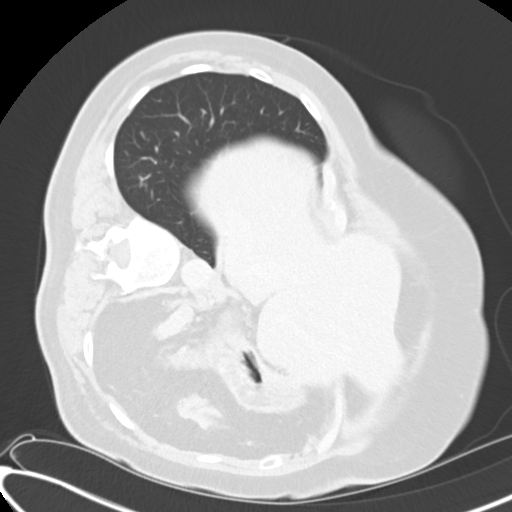

[Series 21: cor delay delay left lateral 2.00 cor · coronal · delayed · 0.71mm/px · 2 of 175 slices shown, 3 images]
[im 59/175  soft-tissue]
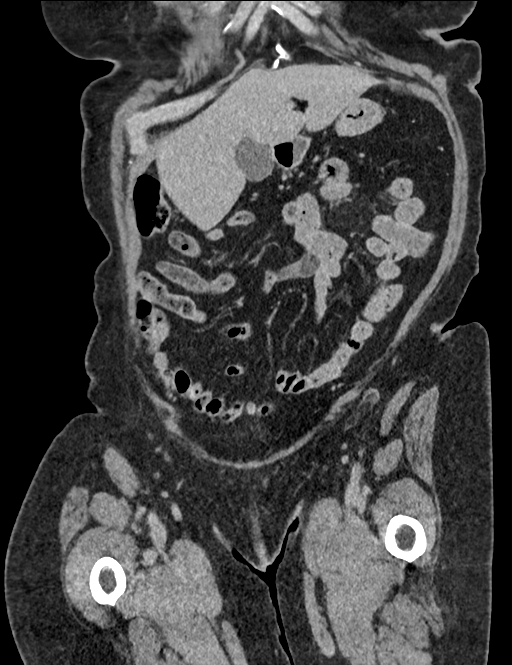
[im 59/175  bone]
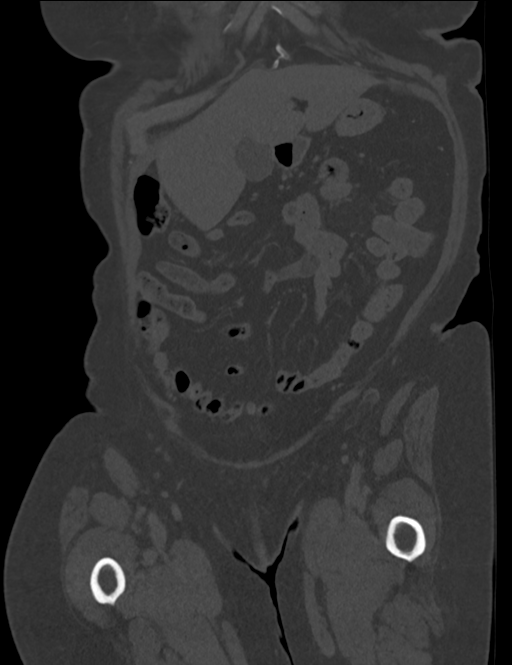
[im 117/175  soft-tissue]
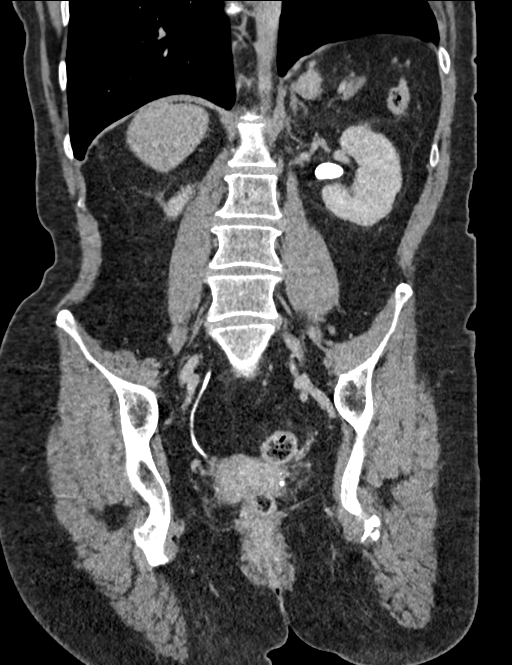

[11 of 46 positions shown; findings below may reference images not displayed]

FINDINGS: Lower chest: No acute abnormality. Small hiatal hernia. Normal size
heart. No significant pericardial effusion/thickening.

Hepatobiliary: No suspicious hepatic lesion. Gallbladder is
unremarkable. No biliary ductal dilation.

Pancreas: No pancreatic ductal dilation or evidence of acute
inflammation. Fatty infiltration along the head of the pancreas on
image 24-25/9. Apparent hypodense area in the head/uncinate process
of the pancreas for instance on image [DATE], which given the lack of
masslike or invasive features, no celiac axis stranding and no
common/pancreatic ductal dilation this is favored to represent a
combination of fatty infiltration and volume averaging.

Spleen: Atrophic spleen.

Adrenals/Urinary Tract: Bilateral adrenal glands are unremarkable.

No hydronephrosis. Symmetric enhancement excretion of contrast in
the bilateral kidneys. No suspicious filling defect visualized
within the opacified portions of the collecting system or ureters on
delayed imaging.

1.5 cm right renal cyst. Other bilateral hypodense renal lesions
technically too small to accurately characterize but also favored
represent cysts. No solid enhancing renal lesions. No renal,
ureteral or bladder calculus.

Urinary bladder is grossly unremarkable.

Stomach/Bowel: Small hiatal hernia otherwise the stomach is grossly
unremarkable. Normal positioning of the duodenum/ligament of [place].
No suspicious small bowel wall thickening or dilation. Normal
appendix. No suspicious colonic wall thickening or mass like
lesions.

Vascular/Lymphatic: Aortic atherosclerosis. No pathologically
enlarged abdominal or pelvic lymph nodes.

Reproductive: Thickening of the endometrial canal measuring 7 mm.
Bilateral adnexa are unremarkable.

Other: No abdominopelvic ascites.

Musculoskeletal: Multilevel degenerative changes spine. No acute
osseous abnormality.
IMPRESSION: 1. No hydronephrosis. No renal, ureteral, or bladder calculus. No
solid enhancing renal mass.
2. Thickening of the endometrial canal measuring 7 mm, nonspecific.
Further evaluation with pelvic ultrasound is suggested.
3. Small hiatal hernia.
4. Aortic atherosclerosis.

Aortic Atherosclerosis ([id number].A).

## 2022-02-19 ENCOUNTER — Other Ambulatory Visit: Payer: Self-pay | Admitting: Cardiovascular Disease

## 2022-02-19 DIAGNOSIS — I4821 Permanent atrial fibrillation: Secondary | ICD-10-CM

## 2022-05-24 ENCOUNTER — Other Ambulatory Visit: Payer: Self-pay | Admitting: Cardiovascular Disease

## 2022-05-24 DIAGNOSIS — I4821 Permanent atrial fibrillation: Secondary | ICD-10-CM

## 2022-05-24 NOTE — Telephone Encounter (Signed)
Refill request

## 2022-05-24 NOTE — Telephone Encounter (Signed)
Prescription refill request for Eliquis received. Indication: Afib  Last office visit: 10/26/21 ([place])  Scr: 0.9 (04/05/22)  Age: 77 Weight: 83.5kg  Appropriate dose. Refill sent.

## 2022-12-09 NOTE — Progress Notes (Unsigned)
Cardiology Office Note  Date:  12/09/2022   ID:  Alicia Krause, DOB 1945-06-06, MRN [id number]  PCP:  Gracelyn Nurse, MD   No chief complaint on file.   HPI:  [name] is a 77 year old woman with past medical history of Persistent atrial fibrillation since February 2019 Prior smoking history COPD exacerbation February 2019 with pneumonia, atrial fib Severe chronic back pain, s/p surgery, additional pain medication Who presents for f/u of her atrial fibrillation  Seen in clinic by myself September 2022 Seen by one of our providers October 2023  Feels well, no complaints Tolerating Eliquis Exercising on a regular basis, trying to lose weight Denies shortness of breath Has occasional abdominal fullness, feels bloated High water intake  Back stable, but has chronic pain Still bothering her today  Denies shortness of breath on exertion  EKG personally reviewed by myself on todays visit Shows atrial fibrillation with ventricular rate 85 bpm no significant ST or T wave change   Other past medical history reviewed hospitalized 03/04/2017 with COPD exacerbation and pneumonia.   in atrial fibrillation with rapid ventricular rate.   2D echocardiogram was performed 03/05/2017 which revealed normal left ventricular function, with LVEF of 60-65%.   24-hour Holter monitor revealed predominant atrial fibrillation with a mean heart rate of 71 bpm.   EKG performed 04/2017 showed atrial fibrillation    trial of flecainide 50 mg started on 03/31/17.   frequent dizzy spells and tremors.     PMH:   has a past medical history of Asthma, COPD (chronic obstructive pulmonary disease) (HCC), History of echocardiogram, Hypothyroidism, and Permanent atrial fibrillation (HCC).  PSH:    Past Surgical History:  Procedure Laterality Date   AUGMENTATION MAMMAPLASTY Bilateral 1989   SILICONE - REPLACED IN 2003 WITH COMBO - RETROPECTORALIS    Current Outpatient Medications  Medication Sig  Dispense Refill   albuterol (PROVENTIL HFA;VENTOLIN HFA) 108 (90 Base) MCG/ACT inhaler Inhale 2 puffs into the lungs every 6 (six) hours as needed for wheezing or shortness of breath.     carbidopa-levodopa (SINEMET IR) 25-100 MG tablet Take 1 tablet by mouth daily.   10   ELIQUIS 5 MG TABS tablet TAKE 1 TABLET(5 MG) BY MOUTH TWICE DAILY 180 tablet 1   furosemide (LASIX) 20 MG tablet Take 1 tablet (20 mg total) by mouth as needed. For abdominal swelling, edema, or shortness of breath (Patient taking differently: Take 20 mg by mouth as needed for edema (AND SOB). For abdominal swelling, edema, or shortness of breath) 90 tablet 3   levothyroxine (SYNTHROID, LEVOTHROID) 112 MCG tablet Take 112 mcg by mouth daily before breakfast.   0   oxyCODONE-acetaminophen (PERCOCET) 10-325 MG tablet Take 1 tablet by mouth 3 (three) times daily.  0   potassium chloride (KLOR-CON) 10 MEQ tablet Take 1 tablet (10 mEq total) by mouth as needed. Take with lasix (Patient taking differently: Take 10 mEq by mouth as needed (SEE BELOW). Take with lasix) 90 tablet 3   No current facility-administered medications for this visit.     Allergies:   Latex   Social History:  The patient  reports that she quit smoking about 10 years ago. Her smoking use included cigarettes. She started smoking about 40 years ago. She has never used smokeless tobacco. She reports that she does not currently use alcohol. She reports current drug use. Drug: Oxycodone.   Family History:   family history includes Heart disease in her brother, father, mother, and sister; Heart  failure in her brother, father, mother, and sister.    Review of Systems: Review of Systems  Constitutional: Negative.   HENT:         Gum bleeding  Respiratory: Negative.    Cardiovascular: Negative.   Gastrointestinal: Negative.   Musculoskeletal:  Positive for back pain.  Neurological: Negative.   Psychiatric/Behavioral: Negative.    All other systems reviewed and  are negative.    PHYSICAL EXAM: VS:  There were no vitals taken for this visit. , BMI There is no height or weight on file to calculate BMI. Constitutional:  oriented to person, place, and time. No distress.  HENT:  Head: Grossly normal Eyes:  no discharge. No scleral icterus.  Neck: No JVD, no carotid bruits  Cardiovascular: Regular rate and rhythm, no murmurs appreciated Pulmonary/Chest: Clear to auscultation bilaterally, no wheezes or rails Abdominal: Soft.  no distension.  no tenderness.  Musculoskeletal: Normal range of motion Neurological:  normal muscle tone. Coordination normal. No atrophy Skin: Skin warm and dry Psychiatric: normal affect, pleasant  Recent Labs: No results found for requested labs within last 365 days.    Lipid Panel No results found for: "CHOL", "HDL", "LDLCALC", "TRIG"    Wt Readings from Last 3 Encounters:  10/26/21 184 lb (83.5 kg)  10/10/20 183 lb 2 oz (83.1 kg)  07/17/20 179 lb (81.2 kg)     ASSESSMENT AND PLAN:  Atrial fibrillation, permanent (HCC) - Plan: EKG 12-Lead Persistent atrial fibrillation noted February 2019 Now permanent Tolerating Eliquis 5 twice daily, refilled Rate well controlled May have some fluid retention from the atrial fibrillation, recommended Lasix 20 with potassium 10 as needed for abdominal distention/bloating  Chronic low back pain, unspecified back pain laterality, unspecified whether sciatica present Chronic issues,  Still with chronic pain Able to do some exercises  Smoker Stopped smoking 8 years ago  Centrilobular emphysema (HCC) Prior COPD exacerbation pneumonia February 2019 No recent COPD exacerbation  Aortic atherosclerosis Seen on CT scan 2019, minimal in the aorta No significant coronary calcification noted but difficult to visualize   Total encounter time more than 25 minutes  Greater than 50% was spent in counseling and coordination of care with the patient    No orders of the defined  types were placed in this encounter.    Signed, Dossie Arbour, M.D., Ph.D. 12/09/2022  [facility] [place], [place] [phone]

## 2022-12-10 ENCOUNTER — Ambulatory Visit: Payer: Medicare Other | Attending: Cardiovascular Disease | Admitting: Cardiovascular Disease

## 2022-12-10 ENCOUNTER — Encounter: Payer: Self-pay | Admitting: Cardiovascular Disease

## 2022-12-10 VITALS — BP 120/62 | HR 76 | Ht 64.0 in | Wt 188.1 lb

## 2022-12-10 DIAGNOSIS — J432 Centrilobular emphysema: Secondary | ICD-10-CM | POA: Diagnosis not present

## 2022-12-10 DIAGNOSIS — I700 Atherosclerosis of aorta: Secondary | ICD-10-CM

## 2022-12-10 DIAGNOSIS — I4821 Permanent atrial fibrillation: Secondary | ICD-10-CM | POA: Diagnosis not present

## 2022-12-10 MED ORDER — FUROSEMIDE 20 MG PO TABS: 20.0000 mg | ORAL_TABLET | ORAL | 3 refills | Status: DC | PRN

## 2022-12-10 MED ORDER — APIXABAN 5 MG PO TABS: 5.0000 mg | ORAL_TABLET | Freq: Two times a day (BID) | ORAL | 3 refills | Status: DC

## 2022-12-10 MED ORDER — POTASSIUM CHLORIDE ER 10 MEQ PO TBCR: 10.0000 meq | EXTENDED_RELEASE_TABLET | ORAL | 3 refills | Status: DC | PRN

## 2022-12-10 NOTE — Patient Instructions (Signed)

## 2023-12-12 ENCOUNTER — Ambulatory Visit: Admitting: Cardiovascular Disease

## 2023-12-14 NOTE — Progress Notes (Unsigned)
 Cardiology Office Note  Date:  12/15/2023   ID:  Alicia Krause, DOB 1945/07/12, MRN [id number]  PCP:  Rudolpho Norleen BIRCH, MD   Chief Complaint  Patient presents with   6 month follow up     Patient c/o shorntess of breath with little activity and stabbing pains in chest that comes and goes.     HPI:  [name] is a 78 year old woman with past medical history of Permanent atrial fibrillation since February 2019 Prior smoking history COPD exacerbation February 2019 with pneumonia, atrial fib Severe chronic back pain, s/p surgery, additional pain medication Who presents for f/u of her permanent atrial fibrillation, shortness of breath, COPD  Seen in clinic by myself 11/24 On discussion today reports that she is active Uses her pedal bike 3x a day, walks dog 2x a day Hobbies  Recent elevated total cholesterol 220 Now on lipitor 10 daily for over 1 month  Reports symptoms of ABD bloating, takes Linzess Denies excessive constipation Not taking Lasix  Denies leg swelling  No chest pain concerning for angina  Asymptomatic from atrial fibrillation, rare palpitation Heart rate well-controlled without rate controlling medication  EKG personally reviewed by myself on todays visit EKG Interpretation Date/Time:  Monday December 15 2023 15:26:51 EST Ventricular Rate:  73 PR Interval:    QRS Duration:  70 QT Interval:  372 QTC Calculation: 409 R Axis:   56  Text Interpretation: Atrial fibrillation When compared with ECG of 10-Dec-2022 14:53, No significant change was found Confirmed by Perla Lye [phone]) on 12/15/2023 3:36:43 PM    Other past medical history reviewed hospitalized 03/04/2017 with COPD exacerbation and pneumonia.   in atrial fibrillation with rapid ventricular rate.   2D echocardiogram was performed 03/05/2017 which revealed normal left ventricular function, with LVEF of 60-65%.   24-hour Holter monitor revealed predominant atrial fibrillation with a mean  heart rate of 71 bpm.   EKG performed 04/2017 showed atrial fibrillation    trial of flecainide 50 mg started on 03/31/17.   frequent dizzy spells and tremors.     PMH:   has a past medical history of Asthma, COPD (chronic obstructive pulmonary disease) (HCC), History of echocardiogram, Hypothyroidism, and Permanent atrial fibrillation (HCC).  PSH:    Past Surgical History:  Procedure Laterality Date   AUGMENTATION MAMMAPLASTY Bilateral 1989   SILICONE - REPLACED IN 2003 WITH COMBO - RETROPECTORALIS    Current Outpatient Medications  Medication Sig Dispense Refill   albuterol  (PROVENTIL  HFA;VENTOLIN  HFA) 108 (90 Base) MCG/ACT inhaler Inhale 2 puffs into the lungs every 6 (six) hours as needed for wheezing or shortness of breath.     apixaban  (ELIQUIS ) 5 MG TABS tablet Take 1 tablet (5 mg total) by mouth 2 (two) times daily. 180 tablet 3   atorvastatin (LIPITOR) 10 MG tablet Take 10 mg by mouth daily.     carbidopa -levodopa  (SINEMET  IR) 25-100 MG tablet Take 1 tablet by mouth daily.   10   furosemide  (LASIX ) 20 MG tablet Take 1 tablet (20 mg total) by mouth as needed. For abdominal swelling, edema, or shortness of breath 90 tablet 3   levothyroxine  (SYNTHROID ) 137 MCG tablet Take 137 mcg by mouth daily.     linaclotide (LINZESS) 72 MCG capsule Take 72 mcg by mouth daily before breakfast.     oxyCODONE -acetaminophen  (PERCOCET) 10-325 MG tablet Take 1 tablet by mouth 3 (three) times daily.  0   potassium chloride  (KLOR-CON ) 10 MEQ tablet Take 1 tablet (10 mEq total) by  mouth as needed. Take with lasix  90 tablet 3   No current facility-administered medications for this visit.   Allergies:   Latex   Social History:  The patient  reports that she quit smoking about 11 years ago. Her smoking use included cigarettes. She started smoking about 41 years ago. She has never used smokeless tobacco. She reports that she does not currently use alcohol. She reports current drug use. Drug: Oxycodone .    Family History:   family history includes Heart disease in her brother, father, mother, and sister; Heart failure in her brother, father, mother, and sister.   Review of Systems: Review of Systems  Constitutional: Negative.   Respiratory: Negative.    Cardiovascular: Negative.   Gastrointestinal: Negative.   Musculoskeletal:  Positive for back pain.  Neurological: Negative.   Psychiatric/Behavioral: Negative.    All other systems reviewed and are negative.  PHYSICAL EXAM: VS:  BP 130/70 (BP Location: Left Arm, Patient Position: Sitting, Cuff Size: Normal)   Pulse 73   Ht 5' 4 (1.626 m)   Wt 191 lb (86.6 kg)   SpO2 90%   BMI 32.79 kg/m  , BMI Body mass index is 32.79 kg/m. Constitutional:  oriented to person, place, and time. No distress.  HENT:  Head: Normocephalic and atraumatic.  Eyes:  no discharge. No scleral icterus.  Neck: Normal range of motion. Neck supple. No JVD present.  Cardiovascular: Normal rate, regular rhythm, normal heart sounds and intact distal pulses. Exam reveals no gallop and no friction rub. No edema No murmur heard. Pulmonary/Chest: Effort normal and breath sounds normal. No stridor. No respiratory distress.  no wheezes.  no rales.  no tenderness.  Abdominal: Soft.  no distension.  no tenderness.  Musculoskeletal: Normal range of motion.  no  tenderness or deformity.  Neurological:  normal muscle tone. Coordination normal. No atrophy Skin: Skin is warm and dry. No rash noted. not diaphoretic.  Psychiatric:  normal mood and affect. behavior is normal. Thought content normal.   Recent Labs: No results found for requested labs within last 365 days.    Lipid Panel No results found for: CHOL, HDL, LDLCALC, TRIG    Wt Readings from Last 3 Encounters:  12/15/23 191 lb (86.6 kg)  12/10/22 188 lb 2 oz (85.3 kg)  10/26/21 184 lb (83.5 kg)     ASSESSMENT AND PLAN:  Atrial fibrillation, permanent (HCC)  Permanent atrial fibrillation noted  February 2019 Tolerating Eliquis  5 twice daily,  Recommend Lasix  with potassium as needed for abdominal bloating  Chronic low back pain, unspecified back pain laterality, unspecified whether sciatica present Chronic issues, chronic pain  Smoker Stopped smoking >8 years ago  Centrilobular emphysema (HCC) Prior COPD exacerbation pneumonia February 2019 No recent COPD exacerbation  Aortic atherosclerosis Seen on CT scan 2019, minimal in the aorta Cholesterol elevated 220, now on Lipitor    Signed, Velinda Lunger, M.D., Ph.D. 12/15/2023  [facility] [place], [place] [phone]

## 2023-12-15 ENCOUNTER — Encounter: Payer: Self-pay | Admitting: Cardiovascular Disease

## 2023-12-15 ENCOUNTER — Ambulatory Visit: Attending: Cardiovascular Disease | Admitting: Cardiovascular Disease

## 2023-12-15 VITALS — BP 130/70 | HR 73 | Ht 64.0 in | Wt 191.0 lb

## 2023-12-15 DIAGNOSIS — I4821 Permanent atrial fibrillation: Secondary | ICD-10-CM

## 2023-12-15 DIAGNOSIS — J45909 Unspecified asthma, uncomplicated: Secondary | ICD-10-CM | POA: Diagnosis not present

## 2023-12-15 DIAGNOSIS — I700 Atherosclerosis of aorta: Secondary | ICD-10-CM | POA: Diagnosis not present

## 2023-12-15 DIAGNOSIS — J432 Centrilobular emphysema: Secondary | ICD-10-CM | POA: Diagnosis not present

## 2023-12-15 MED ORDER — POTASSIUM CHLORIDE ER 10 MEQ PO TBCR: 10.0000 meq | EXTENDED_RELEASE_TABLET | ORAL | 3 refills | Status: AC | PRN

## 2023-12-15 MED ORDER — APIXABAN 5 MG PO TABS
5.0000 mg | ORAL_TABLET | Freq: Two times a day (BID) | ORAL | 3 refills | Status: AC
Start: 2023-12-15 — End: ?

## 2023-12-15 MED ORDER — FUROSEMIDE 20 MG PO TABS: 20.0000 mg | ORAL_TABLET | ORAL | 3 refills | Status: AC | PRN

## 2023-12-15 NOTE — Patient Instructions (Addendum)
 Medication Instructions:  No changes  Try lasix  with potassium as needed for bloating  Could try Senna/colace mix pill every other day for bloating  If you need a refill on your cardiac medications before your next appointment, please call your pharmacy.   Lab work: No new labs needed  Testing/Procedures: No new testing needed  Follow-Up: At [facility], you and your health needs are our priority.  As part of our continuing mission to provide you with exceptional heart care, we have created designated Provider Care Teams.  These Care Teams include your primary Cardiologist (physician) and Advanced Practice Providers (APPs -  Physician Assistants and Nurse Practitioners) who all work together to provide you with the care you need, when you need it.  You will need a follow up appointment in 12 months  Providers on your designated Care Team:   Lonni Meager, NP Bernardino Bring, PA-C Cadence [place], [place]  COVID-19 Vaccine Information can be found at: [email or web address] For questions related to vaccine distribution or appointments, please email [email or web address] or call [phone].
